# Patient Record
Sex: Female | Born: 1960 | Race: White | Hispanic: No | Marital: Married
Health system: Midwestern US, Academic
[De-identification: ages and names within clinical notes are randomized; demographics above are authoritative.]

## PROBLEM LIST (undated history)

## (undated) DIAGNOSIS — I82409 Acute embolism and thrombosis of unspecified deep veins of unspecified lower extremity: Secondary | ICD-10-CM

## (undated) DIAGNOSIS — E2839 Other primary ovarian failure: Secondary | ICD-10-CM

## (undated) DIAGNOSIS — B009 Herpesviral infection, unspecified: Secondary | ICD-10-CM

## (undated) DIAGNOSIS — M1990 Unspecified osteoarthritis, unspecified site: Secondary | ICD-10-CM

## (undated) HISTORY — DX: Unspecified osteoarthritis, unspecified site: M19.90

## (undated) HISTORY — DX: Acute embolism and thrombosis of unspecified deep veins of unspecified lower extremity: I82.409

## (undated) HISTORY — PX: ROOT CANAL: SHX2363

## (undated) HISTORY — DX: Other primary ovarian failure: E28.39

## (undated) HISTORY — DX: Herpesviral infection, unspecified: B00.9

---

## 2002-02-10 ENCOUNTER — Other Ambulatory Visit: Admission: RE | Admit: 2002-02-10 | Discharge: 2002-02-10 | Payer: Self-pay | Admitting: Obstetrics and Gynecology

## 2003-02-16 ENCOUNTER — Other Ambulatory Visit: Admission: RE | Admit: 2003-02-16 | Discharge: 2003-02-16 | Payer: Self-pay | Admitting: Obstetrics and Gynecology

## 2003-03-02 ENCOUNTER — Encounter: Admission: RE | Admit: 2003-03-02 | Discharge: 2003-03-02 | Payer: Self-pay | Admitting: Obstetrics and Gynecology

## 2004-02-28 ENCOUNTER — Other Ambulatory Visit: Admission: RE | Admit: 2004-02-28 | Discharge: 2004-02-28 | Payer: Self-pay | Admitting: Obstetrics and Gynecology

## 2004-07-10 ENCOUNTER — Encounter: Admission: RE | Admit: 2004-07-10 | Discharge: 2004-07-10 | Payer: Self-pay | Admitting: Obstetrics and Gynecology

## 2004-07-30 ENCOUNTER — Encounter: Admission: RE | Admit: 2004-07-30 | Discharge: 2004-07-30 | Payer: Self-pay | Admitting: Obstetrics and Gynecology

## 2005-03-16 ENCOUNTER — Encounter: Admission: RE | Admit: 2005-03-16 | Discharge: 2005-03-16 | Payer: Self-pay | Admitting: Sports Medicine

## 2005-04-23 ENCOUNTER — Other Ambulatory Visit: Admission: RE | Admit: 2005-04-23 | Discharge: 2005-04-23 | Payer: Self-pay | Admitting: Obstetrics and Gynecology

## 2005-07-17 ENCOUNTER — Encounter: Admission: RE | Admit: 2005-07-17 | Discharge: 2005-07-17 | Payer: Self-pay | Admitting: Obstetrics and Gynecology

## 2006-04-29 ENCOUNTER — Other Ambulatory Visit: Admission: RE | Admit: 2006-04-29 | Discharge: 2006-04-29 | Payer: Self-pay | Admitting: Obstetrics and Gynecology

## 2006-09-23 ENCOUNTER — Encounter: Admission: RE | Admit: 2006-09-23 | Discharge: 2006-09-23 | Payer: Self-pay | Admitting: Obstetrics and Gynecology

## 2007-04-09 DIAGNOSIS — I82409 Acute embolism and thrombosis of unspecified deep veins of unspecified lower extremity: Secondary | ICD-10-CM

## 2007-04-09 HISTORY — DX: Acute embolism and thrombosis of unspecified deep veins of unspecified lower extremity: I82.409

## 2007-07-29 ENCOUNTER — Other Ambulatory Visit: Admission: RE | Admit: 2007-07-29 | Discharge: 2007-07-29 | Payer: Self-pay | Admitting: Gynecology

## 2007-09-28 ENCOUNTER — Encounter: Admission: RE | Admit: 2007-09-28 | Discharge: 2007-09-28 | Payer: Self-pay | Admitting: Gynecology

## 2008-01-14 ENCOUNTER — Emergency Department (HOSPITAL_COMMUNITY): Admission: EM | Admit: 2008-01-14 | Discharge: 2008-01-14 | Payer: Self-pay | Admitting: Emergency Medicine

## 2008-01-14 ENCOUNTER — Ambulatory Visit (HOSPITAL_COMMUNITY): Admission: RE | Admit: 2008-01-14 | Discharge: 2008-01-14 | Payer: Self-pay | Admitting: Gynecology

## 2008-01-14 ENCOUNTER — Ambulatory Visit: Payer: Self-pay | Admitting: Vascular Surgery

## 2008-01-14 ENCOUNTER — Encounter: Payer: Self-pay | Admitting: Gynecology

## 2008-01-18 ENCOUNTER — Ambulatory Visit: Payer: Self-pay | Admitting: Gynecology

## 2008-01-19 ENCOUNTER — Ambulatory Visit: Payer: Self-pay | Admitting: Oncology

## 2008-01-21 ENCOUNTER — Ambulatory Visit: Payer: Self-pay | Admitting: Cardiovascular Disease

## 2008-01-21 LAB — CONVERTED CEMR LAB
INR: 1.3 — ABNORMAL HIGH
Prothrombin Time: 15.3 s — ABNORMAL HIGH

## 2008-01-25 ENCOUNTER — Ambulatory Visit: Payer: Self-pay | Admitting: Internal Medicine

## 2008-01-25 LAB — CONVERTED CEMR LAB
INR: 1.3 — ABNORMAL HIGH (ref 0.8–1.0)
INR: 1.6 — ABNORMAL HIGH (ref 0.8–1.0)
Prothrombin Time: 15.3 s — ABNORMAL HIGH (ref 10.9–13.3)
Prothrombin Time: 17.8 s — ABNORMAL HIGH (ref 10.9–13.3)

## 2008-01-28 ENCOUNTER — Ambulatory Visit: Payer: Self-pay | Admitting: Cardiovascular Disease

## 2008-01-28 LAB — CONVERTED CEMR LAB
INR: 2.4 — ABNORMAL HIGH (ref 0.8–1.0)
Prothrombin Time: 25.7 s — ABNORMAL HIGH (ref 10.9–13.3)

## 2008-02-04 ENCOUNTER — Ambulatory Visit: Payer: Self-pay | Admitting: Cardiovascular Disease

## 2008-02-04 LAB — CONVERTED CEMR LAB
INR: 4.9 — ABNORMAL HIGH (ref 0.8–1.0)
Prothrombin Time: 48.2 s — ABNORMAL HIGH (ref 10.9–13.3)

## 2008-02-10 ENCOUNTER — Ambulatory Visit: Payer: Self-pay | Admitting: Cardiovascular Disease

## 2008-02-18 ENCOUNTER — Ambulatory Visit: Payer: Self-pay | Admitting: Cardiology

## 2008-02-22 ENCOUNTER — Ambulatory Visit: Payer: Self-pay | Admitting: Cardiology

## 2008-02-26 ENCOUNTER — Ambulatory Visit: Payer: Self-pay | Admitting: Cardiovascular Disease

## 2008-03-09 ENCOUNTER — Ambulatory Visit: Payer: Self-pay | Admitting: Oncology

## 2008-03-09 LAB — PROTIME-INR
INR: 2.1 (ref 2.00–3.50)
Protime: 25.2 Seconds — ABNORMAL HIGH (ref 10.6–13.4)

## 2008-03-09 LAB — CBC & DIFF AND RETIC
BASO%: 0.6 % (ref 0.0–2.0)
Basophils Absolute: 0 10*3/uL (ref 0.0–0.1)
EOS%: 1.1 % (ref 0.0–7.0)
Eosinophils Absolute: 0.1 10*3/uL (ref 0.0–0.5)
HCT: 36.8 % (ref 34.8–46.6)
HGB: 12.5 g/dL (ref 11.6–15.9)
IRF: 0.39 — ABNORMAL HIGH (ref 0.130–0.330)
LYMPH%: 22.8 % (ref 14.0–48.0)
MCH: 29.8 pg (ref 26.0–34.0)
MCHC: 33.9 g/dL (ref 32.0–36.0)
MCV: 87.9 fL (ref 81.0–101.0)
MONO#: 0.6 10*3/uL (ref 0.1–0.9)
MONO%: 7.4 % (ref 0.0–13.0)
NEUT#: 5.1 10*3/uL (ref 1.5–6.5)
NEUT%: 68.1 % (ref 39.6–76.8)
Platelets: 243 10*3/uL (ref 145–400)
RBC: 4.19 10*6/uL (ref 3.70–5.32)
RDW: 12.7 % (ref 11.3–14.5)
RETIC #: 57.8 10*3/uL (ref 19.7–115.1)
Retic %: 1.4 % (ref 0.4–2.3)
WBC: 7.5 10*3/uL (ref 3.9–10.0)
lymph#: 1.7 10*3/uL (ref 0.9–3.3)

## 2008-03-09 LAB — CHCC SMEAR

## 2008-03-09 LAB — MORPHOLOGY
PLT EST: ADEQUATE
RBC Comments: NORMAL

## 2008-03-11 ENCOUNTER — Ambulatory Visit: Payer: Self-pay | Admitting: Internal Medicine

## 2008-03-15 LAB — CARDIOLIPIN ANTIBODIES, IGG, IGM, IGA
Anticardiolipin IgA: 15 [APL'U] (ref <13)
Anticardiolipin IgG: <7 [GPL'U] (ref <11)
Anticardiolipin IgM: <7 [MPL'U] (ref <10)

## 2008-03-15 LAB — COMPREHENSIVE METABOLIC PANEL
ALT: 11 U/L (ref 0–35)
AST: 12 U/L (ref 0–37)
Albumin: 4.3 g/dL (ref 3.5–5.2)
Alkaline Phosphatase: 18 U/L — ABNORMAL LOW (ref 39–117)
BUN: 18 mg/dL (ref 6–23)
CO2: 22 mEq/L (ref 19–32)
Calcium: 9.2 mg/dL (ref 8.4–10.5)
Chloride: 106 mEq/L (ref 96–112)
Creatinine, Ser: 0.86 mg/dL (ref 0.40–1.20)
Glucose, Bld: 90 mg/dL (ref 70–99)
Potassium: 3.8 mEq/L (ref 3.5–5.3)
Sodium: 138 mEq/L (ref 135–145)
Total Bilirubin: 0.7 mg/dL (ref 0.3–1.2)
Total Protein: 6.5 g/dL (ref 6.0–8.3)

## 2008-03-15 LAB — FACTOR 5 LEIDEN

## 2008-03-15 LAB — LUPUS ANTICOAGULANT PANEL

## 2008-03-15 LAB — LACTATE DEHYDROGENASE: LDH: 136 U/L (ref 94–250)

## 2008-03-15 LAB — BETA-2 GLYCOPROTEIN ANTIBODIES
Beta-2-Glycoprotein I IgA: 4 U/mL (ref <10)
Beta-2-Glycoprotein I IgM: <4 U/mL (ref <10)

## 2008-03-15 LAB — PROTHROMBIN GENE MUTATION

## 2008-03-15 LAB — ANTITHROMBIN III: AntiThromb III Func: 105 % (ref 76–126)

## 2008-03-15 LAB — D-DIMER, QUANTITATIVE: D-Dimer, Quant: 0.23 ug/mL-FEU (ref 0.00–0.48)

## 2008-03-23 ENCOUNTER — Ambulatory Visit: Payer: Self-pay | Admitting: Cardiology

## 2008-04-28 ENCOUNTER — Ambulatory Visit: Payer: Self-pay | Admitting: Gynecology

## 2008-06-30 ENCOUNTER — Ambulatory Visit: Payer: Self-pay | Admitting: Cardiology

## 2008-07-14 ENCOUNTER — Ambulatory Visit: Payer: Self-pay | Admitting: Internal Medicine

## 2008-08-26 ENCOUNTER — Telehealth (INDEPENDENT_AMBULATORY_CARE_PROVIDER_SITE_OTHER): Payer: Self-pay | Admitting: *Deleted

## 2008-08-29 ENCOUNTER — Ambulatory Visit: Payer: Self-pay | Admitting: Cardiovascular Disease

## 2008-09-06 ENCOUNTER — Encounter: Payer: Self-pay | Admitting: *Deleted

## 2008-10-12 ENCOUNTER — Encounter: Payer: Self-pay | Admitting: *Deleted

## 2008-10-19 ENCOUNTER — Encounter: Admission: RE | Admit: 2008-10-19 | Discharge: 2008-10-19 | Payer: Self-pay | Admitting: Gynecology

## 2008-10-19 ENCOUNTER — Ambulatory Visit: Payer: Self-pay | Admitting: Cardiology

## 2008-10-19 LAB — CONVERTED CEMR LAB
POC INR: 0.9
Prothrombin Time: 11.9 s

## 2008-11-16 ENCOUNTER — Other Ambulatory Visit: Admission: RE | Admit: 2008-11-16 | Discharge: 2008-11-16 | Payer: Self-pay | Admitting: Gynecology

## 2008-11-16 ENCOUNTER — Encounter: Payer: Self-pay | Admitting: Gynecology

## 2008-11-16 ENCOUNTER — Ambulatory Visit: Payer: Self-pay | Admitting: Gynecology

## 2008-11-24 ENCOUNTER — Telehealth (INDEPENDENT_AMBULATORY_CARE_PROVIDER_SITE_OTHER): Payer: Self-pay | Admitting: *Deleted

## 2008-12-05 ENCOUNTER — Encounter (INDEPENDENT_AMBULATORY_CARE_PROVIDER_SITE_OTHER): Payer: Self-pay | Admitting: Cardiology

## 2008-12-23 ENCOUNTER — Encounter (INDEPENDENT_AMBULATORY_CARE_PROVIDER_SITE_OTHER): Payer: Self-pay | Admitting: *Deleted

## 2009-01-13 ENCOUNTER — Encounter: Payer: Self-pay | Admitting: Internal Medicine

## 2010-02-22 ENCOUNTER — Other Ambulatory Visit: Admission: RE | Admit: 2010-02-22 | Discharge: 2010-02-22 | Payer: Self-pay | Admitting: Gynecology

## 2010-02-22 ENCOUNTER — Ambulatory Visit: Payer: Self-pay | Admitting: Gynecology

## 2010-03-08 ENCOUNTER — Encounter: Admission: RE | Admit: 2010-03-08 | Discharge: 2010-03-08 | Payer: Self-pay | Admitting: Gynecology

## 2010-04-28 ENCOUNTER — Encounter: Payer: Self-pay | Admitting: Gynecology

## 2010-04-29 ENCOUNTER — Encounter: Payer: Self-pay | Admitting: Gynecology

## 2010-08-21 NOTE — Assessment & Plan Note (Signed)
[facility]                            CARDIOLOGY OFFICE NOTE   Sharon Lopez, Sharon Lopez                     MRN:          [id number]  DATE:02/26/2008                            DOB:          August 06, 1960    HISTORY OF PRESENT ILLNESS:  Sharon Lopez is a pleasant 50 year old  Caucasian female with no significant past medical history, who had a  spontaneous deep venous thrombosis on the left lower extremity that was  diagnosed 5-1/2 weeks ago.  The patient had no precipitating factors for  this DVT.  She denied having any long car trips, long airplane trips,  immobility, or trauma to her leg.  She has had no prior episodes of DVT.  She has been on birth control pills since she was 50 years old.  She has  been followed here in the [facility] in our office and comes in to  see me today as we require [facility] patients to be seen during  their period of monitoring in our office.  She has no complaints except  for pain in her left foot.  She tells me that she twisted it while  playing tennis last week.  She has been maintained on Coumadin and  Lovenox because of an inability to regulate her INR appropriately.  She  denies having any chest pain, shortness of breath, palpitations,  dizziness, near syncope, syncope, orthopnea, PND, or swelling in her  feet.  She initially became aware of the clot in her left leg when she  noticed that her leg was swollen and was painful.  She was sent to the  [facility] by her gynecologist.   PAST MEDICAL HISTORY:  1. Left lower extremity DVT diagnosed 5-1/2 weeks ago.  2. No history of diabetes mellitus, hypertension, hyperlipidemia, or      cardiac disease.   PAST SURGICAL HISTORY:  None.   ALLERGIES:  No known drug allergies.   CURRENT MEDICATIONS:  Coumadin and Lovenox.   SOCIAL HISTORY:  The patient denies the use of tobacco or illicit drugs.  She has several glasses of wine 3-4 times per week.  She is  married and  has 3 children.  She is an active individual and plays tennis often and  works out frequently.   FAMILY HISTORY:  The patient's mother and father are both alive and  healthy.  Her mother does have hypertension.  She has one sister, who  has no medical problems.  She has no family history of clotting  disorders, sudden cardiac death, or premature coronary artery disease.   REVIEW OF SYSTEMS:  As stated in the history of present illness, is  otherwise negative.   PHYSICAL EXAMINATION:  VITAL SIGNS:  Blood pressure 110/70, pulse 65 and  regular, respirations 12 and nonlabored.  GENERAL:  She is a pleasant, young Caucasian female in no acute  distress.  She is alert and oriented x3.  PSYCHIATRIC:  Mood and affect are normal.  MUSCULOSKELETAL:  Muscle strength and tone is normal.  NEUROLOGICAL:  No focal neurological deficits.  SKIN:  Warm and dry.  HEENT:  Normal.  NECK:  No JVD.  No carotid bruits.  No thyromegaly.  No lymphadenopathy.  LUNGS:  Clear to auscultation bilaterally without wheezes, rhonchi, or  crackles noted.  CARDIOVASCULAR:  Regular rate and rhythm without murmurs, gallops, or  rubs noted.  ABDOMEN:  Soft and nontender.  Bowel sounds are present.  EXTREMITIES:  No evidence of lower extremity edema.  Pulses are 2+ in  all extremities.  There is no pain to palpation of the right or left  calf muscle.   DIAGNOSTIC STUDIES:  A 12-lead EKG obtained in our office today shows  normal sinus rhythm with a ventricular rate of 65 beats per minute.  This is a normal EKG.   ASSESSMENT AND PLAN:  This is a pleasant 50 year old Caucasian female  with recent diagnosis of an unprovoked deep vein thrombosis in the left  lower extremity.  The patient has no family history of clotting  disorders or spontaneous abortions.  She also does not have a clear  reason why she has a deep vein thrombosis in her left leg.  I would like  to continue to follow her here in [facility]in  Clinic.  I think it would be  appropriate to send her to see Sharon Lopez of Hematology to assess  the possibility of a clotting disorder.  We will make arrangements for  the patient to be seen in his office.  We will plan on continuing to  follow her in the [facility], however, I will not see her back in  our Cardiology office unless she needs to be seen.     Sharon Carrow, MD  Electronically Signed    CM/MedQ  DD: 02/26/2008  DT: 02/27/2008  Job #: [id number]   cc:   Sharon Lopez, M.D.

## 2010-09-26 ENCOUNTER — Institutional Professional Consult (permissible substitution): Payer: Self-pay | Admitting: Gynecology

## 2010-12-14 ENCOUNTER — Ambulatory Visit (INDEPENDENT_AMBULATORY_CARE_PROVIDER_SITE_OTHER): Payer: Self-pay | Admitting: Gynecology

## 2010-12-14 ENCOUNTER — Encounter: Payer: Self-pay | Admitting: Gynecology

## 2010-12-14 DIAGNOSIS — N920 Excessive and frequent menstruation with regular cycle: Secondary | ICD-10-CM

## 2010-12-14 DIAGNOSIS — N644 Mastodynia: Secondary | ICD-10-CM

## 2010-12-14 DIAGNOSIS — N63 Unspecified lump in unspecified breast: Secondary | ICD-10-CM

## 2010-12-14 MED ORDER — CEPHALEXIN 250 MG PO CAPS: 250.0000 mg | ORAL_CAPSULE | Freq: Four times a day (QID) | ORAL | Status: AC

## 2010-12-14 NOTE — Progress Notes (Signed)
Patient presents having awoken this morning with her left breast wall and greater than her right. She notes it's tender and just a generalized swelling. No history of trauma or episodes like this in the past. She has no nipple discharge. Menses are regular no skipped menses last mammogram was December 2011 it was normal.  Exam Neck: No cervical or supraclavicular adenopathy Breasts: Examined lying and sitting. Right without masses retractions discharge adenopathy. Left visibly larger than the right no focal masses, mildly tender throughout, no skin erythema, vascular dilatation. No nipple discharge axillary adenopathy. Chest wall / upper extremities: No evidence of swelling or abnormalities on her chest wall or upper extremities to suggest deeper thromboembolic event.  Assessment and plan: Left breast swelling acute. Discussed differential to include infectious such as mastitis, thromboembolic such as deep or superficial, hormonal, breast cancer. Unusual presentation for any of the above given the acute onset, nonpregnant and generalized swelling without any focal areas.  We'll start with the prolactin level and TSH start on antibiotics to cover infectious with Keflex 250 4 times a day x7 and start with diagnostic mammogram and ultrasound. Patient will follow up with me after her studies.  She was also discussing that her periods are heavier as to how the past after she stopped her oral contraceptives I reviewed possible workup to include sonohysterogram. I ordered a CBC with her TSH today. We'll on the ultrasound until we resolve her breast issue. She says she due for annual in November December time frame and she'll follow up for that and then we'll pursue evaluation of her menorrhagia.

## 2010-12-17 ENCOUNTER — Telehealth: Payer: Self-pay | Admitting: *Deleted

## 2010-12-17 NOTE — Telephone Encounter (Signed)
Pt called regarding the appointment for diag. Mammogram and ultrasound for breast center. The breast will call pt today to get appointment set for pt. Lm on pt vm with the above information phone number left on pt voice mail as well.

## 2010-12-19 ENCOUNTER — Ambulatory Visit
Admission: RE | Admit: 2010-12-19 | Discharge: 2010-12-19 | Disposition: A | Discharge status: Home / Self Care | Payer: Self-pay | Source: Ambulatory Visit | Attending: Gynecology | Admitting: Gynecology

## 2010-12-19 ENCOUNTER — Other Ambulatory Visit: Payer: Self-pay | Admitting: Gynecology

## 2010-12-19 DIAGNOSIS — N644 Mastodynia: Secondary | ICD-10-CM

## 2010-12-19 DIAGNOSIS — N63 Unspecified lump in unspecified breast: Secondary | ICD-10-CM

## 2011-01-08 LAB — CBC
MCHC: 33.3
RDW: 12.5

## 2011-01-08 LAB — BASIC METABOLIC PANEL
CO2: 26
Calcium: 8.8
Creatinine, Ser: 1.09
GFR calc Af Amer: >60
GFR calc non Af Amer: 54 — ABNORMAL LOW
Glucose, Bld: 99
Sodium: 137

## 2011-01-08 LAB — APTT: aPTT: 26

## 2011-01-08 LAB — DIFFERENTIAL
Basophils Absolute: 0.1
Basophils Relative: 1
Lymphocytes Relative: 31
Neutro Abs: 4.7
Neutrophils Relative %: 55

## 2011-01-08 LAB — PROTIME-INR
INR: 1
Prothrombin Time: 13

## 2011-11-12 ENCOUNTER — Other Ambulatory Visit: Payer: Self-pay | Admitting: Gynecology

## 2011-11-12 DIAGNOSIS — Z1231 Encounter for screening mammogram for malignant neoplasm of breast: Secondary | ICD-10-CM

## 2011-12-23 ENCOUNTER — Ambulatory Visit
Admission: RE | Admit: 2011-12-23 | Discharge: 2011-12-23 | Disposition: A | Discharge status: Home / Self Care | Payer: Private Health Insurance - Indemnity | Source: Ambulatory Visit | Attending: Gynecology | Admitting: Gynecology

## 2011-12-23 DIAGNOSIS — Z1231 Encounter for screening mammogram for malignant neoplasm of breast: Secondary | ICD-10-CM

## 2011-12-25 ENCOUNTER — Other Ambulatory Visit: Payer: Self-pay | Admitting: *Deleted

## 2011-12-25 DIAGNOSIS — N63 Unspecified lump in unspecified breast: Secondary | ICD-10-CM

## 2011-12-30 ENCOUNTER — Ambulatory Visit
Admission: RE | Admit: 2011-12-30 | Discharge: 2011-12-30 | Disposition: A | Discharge status: Home / Self Care | Payer: Private Health Insurance - Indemnity | Source: Ambulatory Visit | Attending: Gynecology | Admitting: Gynecology

## 2011-12-30 DIAGNOSIS — N63 Unspecified lump in unspecified breast: Secondary | ICD-10-CM

## 2012-08-06 ENCOUNTER — Encounter: Payer: Self-pay | Admitting: Gynecology

## 2012-08-06 ENCOUNTER — Ambulatory Visit (INDEPENDENT_AMBULATORY_CARE_PROVIDER_SITE_OTHER): Payer: Private Health Insurance - Indemnity | Admitting: Gynecology

## 2012-08-06 ENCOUNTER — Other Ambulatory Visit (HOSPITAL_COMMUNITY)
Admission: RE | Admit: 2012-08-06 | Discharge: 2012-08-06 | Disposition: A | Discharge status: Home / Self Care | Payer: Private Health Insurance - Indemnity | Source: Ambulatory Visit | Attending: Gynecology | Admitting: Gynecology

## 2012-08-06 VITALS — BP 112/76 | Ht 66.0 in | Wt 126.0 lb

## 2012-08-06 DIAGNOSIS — Z01419 Encounter for gynecological examination (general) (routine) without abnormal findings: Secondary | ICD-10-CM

## 2012-08-06 DIAGNOSIS — Z1151 Encounter for screening for human papillomavirus (HPV): Secondary | ICD-10-CM | POA: Insufficient documentation

## 2012-08-06 DIAGNOSIS — N951 Menopausal and female climacteric states: Secondary | ICD-10-CM

## 2012-08-06 DIAGNOSIS — G4700 Insomnia, unspecified: Secondary | ICD-10-CM

## 2012-08-06 DIAGNOSIS — N926 Irregular menstruation, unspecified: Secondary | ICD-10-CM

## 2012-08-06 DIAGNOSIS — Z13220 Encounter for screening for lipoid disorders: Secondary | ICD-10-CM

## 2012-08-06 LAB — COMPREHENSIVE METABOLIC PANEL
ALT: 10 U/L (ref 0–35)
AST: 15 U/L (ref 0–37)
Albumin: 4.3 g/dL (ref 3.5–5.2)
Alkaline Phosphatase: 17 U/L — ABNORMAL LOW (ref 39–117)
BUN: 16 mg/dL (ref 6–23)
Potassium: 4.3 mEq/L (ref 3.5–5.3)

## 2012-08-06 LAB — LIPID PANEL
HDL: 91 mg/dL (ref >39)
LDL Cholesterol: 76 mg/dL (ref 0–99)
Triglycerides: 53 mg/dL (ref <150)
VLDL: 11 mg/dL (ref 0–40)

## 2012-08-06 LAB — CBC WITH DIFFERENTIAL/PLATELET
Basophils Absolute: 0.1 10*3/uL (ref 0.0–0.1)
Basophils Relative: 1 % (ref 0–1)
MCHC: 33.9 g/dL (ref 30.0–36.0)
Monocytes Absolute: 0.6 10*3/uL (ref 0.1–1.0)
Neutro Abs: 6.8 10*3/uL (ref 1.7–7.7)
Neutrophils Relative %: 74 % (ref 43–77)
Platelets: 281 10*3/uL (ref 150–400)
RDW: 13.2 % (ref 11.5–15.5)
WBC: 9.1 10*3/uL (ref 4.0–10.5)

## 2012-08-06 MED ORDER — ZOLPIDEM TARTRATE 10 MG PO TABS: 10.0000 mg | ORAL_TABLET | Freq: Every evening | ORAL | Status: DC | PRN

## 2012-08-06 NOTE — Progress Notes (Signed)
ADDY MCMANNIS June 01, 1960 [id number]        52 y.o.  G3P3003 for annual exam.  Several issues noted below.  Past medical history,surgical history, medications, allergies, family history and social history were all reviewed and documented in the EPIC chart. ROS:  Was performed and pertinent positives and negatives are included in the history.  Exam: Kim assistant Filed Vitals:   08/06/12 1446  BP: 112/76  Height: 5\' 6"  (1.676 m)  Weight: 126 lb (57.153 kg)   General appearance  Normal Skin grossly normal Head/Neck normal with no cervical or supraclavicular adenopathy thyroid normal Lungs  clear Cardiac RR, without RMG Abdominal  soft, nontender, without masses, organomegaly or hernia Breasts  examined lying and sitting without masses, retractions, discharge or axillary adenopathy. Pelvic  Ext/BUS/vagina  normal   Cervix  normal Pap/HPV  Uterus  anteverted, normal size, shape and contour, midline and mobile nontender   Adnexa  Without masses or tenderness    Anus and perineum  normal   Rectovaginal  normal sphincter tone without palpated masses or tenderness.    Assessment/Plan:  52 y.o. G23P3003 female for annual exam.   1. Irregular menses/menopausal symptoms. Patient started to have hot flushes and night sweats. Menses have become more irregular where she will have some skips but also spotting in between. We'll check baseline labs to include FSH TSH prolactin and sonohysterogram. Discussed perimenopausal issues. She does have a history of DVT on birth control pills and the issue of HRT discussed. Possible low dose transdermal if significant symptoms but realistic risk of recurrent thrombosis discussed. Alternatives such as OTC soy and Effexor reviewed. We'll rediscuss after lab work and ultrasound. 2. Insomnia. Patient has some issues with insomnia. Asked about sleep aids. Does not want to take nightly but to use occasionally as needed. Ambien 10 mg #30 with 2 refills provided.  Sleep activity risks reviewed. 3. Mammography 12/2011. Continue with annual mammography. SBE monthly review. 4. Pap smear 2011. Pap/HPV done today. No history of abnormal Pap smears. Assuming negative then plan less frequent screening interval. 5. Health maintenance. Baseline CBC comprehensive metabolic panel lipid profile urinalysis ordered along with her above hormone studies. Followup present histogram and lab work and we'll go from there.    Dara Lords MD, 4:51 PM 08/06/2012

## 2012-08-06 NOTE — Patient Instructions (Signed)
Follow up for ultrasound as scheduled 

## 2012-08-07 ENCOUNTER — Other Ambulatory Visit: Payer: Self-pay | Admitting: Gynecology

## 2012-08-07 DIAGNOSIS — R7989 Other specified abnormal findings of blood chemistry: Secondary | ICD-10-CM

## 2012-08-07 LAB — URINALYSIS W MICROSCOPIC + REFLEX CULTURE
Bilirubin Urine: NEGATIVE
Glucose, UA: NEGATIVE mg/dL
Hgb urine dipstick: NEGATIVE
Protein, ur: NEGATIVE mg/dL

## 2012-08-07 LAB — FOLLICLE STIMULATING HORMONE: FSH: 51.9 m[IU]/mL

## 2012-08-07 LAB — PROLACTIN: Prolactin: 7.8 ng/mL

## 2012-08-13 ENCOUNTER — Other Ambulatory Visit: Payer: Self-pay | Admitting: Gynecology

## 2012-08-13 DIAGNOSIS — N926 Irregular menstruation, unspecified: Secondary | ICD-10-CM

## 2012-09-02 ENCOUNTER — Ambulatory Visit (INDEPENDENT_AMBULATORY_CARE_PROVIDER_SITE_OTHER): Payer: Private Health Insurance - Indemnity

## 2012-09-02 ENCOUNTER — Encounter: Payer: Self-pay | Admitting: Gynecology

## 2012-09-02 ENCOUNTER — Ambulatory Visit (INDEPENDENT_AMBULATORY_CARE_PROVIDER_SITE_OTHER): Payer: Private Health Insurance - Indemnity | Admitting: Gynecology

## 2012-09-02 DIAGNOSIS — N840 Polyp of corpus uteri: Secondary | ICD-10-CM

## 2012-09-02 DIAGNOSIS — D259 Leiomyoma of uterus, unspecified: Secondary | ICD-10-CM

## 2012-09-02 DIAGNOSIS — N839 Noninflammatory disorder of ovary, fallopian tube and broad ligament, unspecified: Secondary | ICD-10-CM

## 2012-09-02 DIAGNOSIS — N838 Other noninflammatory disorders of ovary, fallopian tube and broad ligament: Secondary | ICD-10-CM

## 2012-09-02 DIAGNOSIS — R7989 Other specified abnormal findings of blood chemistry: Secondary | ICD-10-CM

## 2012-09-02 DIAGNOSIS — N938 Other specified abnormal uterine and vaginal bleeding: Secondary | ICD-10-CM

## 2012-09-02 DIAGNOSIS — N930 Postcoital and contact bleeding: Secondary | ICD-10-CM

## 2012-09-02 DIAGNOSIS — D252 Subserosal leiomyoma of uterus: Secondary | ICD-10-CM

## 2012-09-02 DIAGNOSIS — N852 Hypertrophy of uterus: Secondary | ICD-10-CM

## 2012-09-02 DIAGNOSIS — N926 Irregular menstruation, unspecified: Secondary | ICD-10-CM

## 2012-09-02 DIAGNOSIS — R799 Abnormal finding of blood chemistry, unspecified: Secondary | ICD-10-CM

## 2012-09-02 DIAGNOSIS — N949 Unspecified condition associated with female genital organs and menstrual cycle: Secondary | ICD-10-CM

## 2012-09-02 LAB — BUN: BUN: 20 mg/dL (ref 6–23)

## 2012-09-02 LAB — CREATININE, SERUM: Creat: 0.92 mg/dL (ref 0.50–1.10)

## 2012-09-02 NOTE — Progress Notes (Addendum)
The patient presents for sonohysterogram due to perimenopausal irregular bleeding. She has both skipped and irregular menses as well as intermenstrual spotting. FSH was elevated at 52. Her creatinine was also elevated at 1.7 and we have repeated that with BUN today.  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 Endometrial echo 13 mm with solid appearing mass. Right ovary normal. Left ovary with probable corpus luteum. Cul-de-sac negative. Sonohysterogram was performed, sterile technique, good distention with large endometrial polyp measuring 25 x 23 x 16 mm. Endometrial sample taken. Patient tolerated well.  Assessment and plan: Perimenopausal irregular bleeding with intermenstrual spotting. Sonohysterogram shows large defect consistent with polyp possible myoma. Discussed situation with patient and we'll plan proceeding with hysteroscopic resection. I reviewed what is involved with this to include the expected intraoperative and postoperative courses. The hysteroscopy D&C use of resectoscope portion also discussed. The risks to include infection, prolonged antibiotics, hemorrhage necessitating transfusion and the risks of transfusion, uterine perforation with internal organ damage such as bowel bladder ureters vessels and nerves necessitating major exploratory reparative surgeries and future reparative surgeries including bowel resection and bladder repair ureteral damage repair ostomy formation was all discussed. Distending media absorption with metabolic complications such as coma seizures discussed. No guarantees as far as relief of irregular bleeding was also discussed that she is perimenopausal and I suspect her irregular menses will continue. She does have a history of DVT associated with birth control pills. The issues of perioperative prophylaxis was also reviewed and possibilities for heparin or low molecular weight treatment also discussed.  Patient agrees with the plan and will schedule this at her convenience. She'll followup for the endometrial biopsy results also. A repeat creatinine and BUN was ordered.

## 2012-09-02 NOTE — Patient Instructions (Signed)
Office will call you to arrange surgery. 

## 2012-09-03 ENCOUNTER — Telehealth: Payer: Self-pay

## 2012-09-03 NOTE — Telephone Encounter (Signed)
Left message for patient to call to discuss scheduling surgery. I also have test result for her.

## 2012-09-16 ENCOUNTER — Telehealth: Payer: Self-pay

## 2012-09-16 NOTE — Telephone Encounter (Signed)
Patient called to check on repeat lab results. Informed BUN and creatinine were normal on repeat.  Patient also inquired about me calling to schedule surgery. She said that she thought that she was just going to watch things and did not realize she needed to schedule surgery.  Dr. Kristie Cowman office note clearly speaks of surgery and that he discussed this with her.  I called her and left message that labs normal and to call me to discuss the surgery.

## 2012-09-22 ENCOUNTER — Telehealth: Payer: Self-pay

## 2012-09-22 NOTE — Telephone Encounter (Signed)
Patient called in voice mail.  She asked again what Dr. Velvet Bathe biopsied and is ready to look at dates for surgery. [place]. I called her back and left voice mail.  I explained that Dr. Velvet Bathe did endometrial biopsy and it was benign. The surgery is to remove the endometrial polyp that he saw at [facility].  I left message and asked her to call me with dates she would like to try to schedule.

## 2012-10-15 ENCOUNTER — Telehealth: Payer: Self-pay

## 2012-10-15 NOTE — Telephone Encounter (Signed)
I called patient as I have left several messages and have not heard from patient.  I reached her today and she acknowledges that she know she needs to have Hyst D&C done but states she has been too busy.  She gave me a few dates in the future she might can make work and I will move forward with trying to get this scheduled and let her know.

## 2012-10-16 ENCOUNTER — Other Ambulatory Visit: Payer: Self-pay | Admitting: Gynecology

## 2012-10-16 ENCOUNTER — Telehealth: Payer: Self-pay

## 2012-10-16 MED ORDER — ENOXAPARIN SODIUM 40 MG/0.4ML ~~LOC~~ SOLN: 40.0000 mg | SUBCUTANEOUS | Status: DC

## 2012-10-16 NOTE — Telephone Encounter (Signed)
I called patient earlier and gave her surgery date Aug 21 7:30am.  She needed to confirm with husband and she called me back to tell me that this date will be fine.  I advised her regarding need for Lovenox 40 mg sub-q daily x 7 days prior to surgery. She said she has done this before and denies need for instruction.  Is familiar.  E-scribed to pharmacy. Consult scheduled.

## 2012-11-13 ENCOUNTER — Encounter (HOSPITAL_COMMUNITY): Payer: Self-pay

## 2012-11-19 ENCOUNTER — Telehealth: Payer: Self-pay

## 2012-11-19 ENCOUNTER — Telehealth: Payer: Self-pay | Admitting: *Deleted

## 2012-11-19 ENCOUNTER — Other Ambulatory Visit: Payer: Self-pay | Admitting: Gynecology

## 2012-11-19 MED ORDER — ENOXAPARIN SODIUM 40 MG/0.4ML ~~LOC~~ SOLN: SUBCUTANEOUS | Status: DC

## 2012-11-19 NOTE — Telephone Encounter (Signed)
Patient called and there was some confusion regarding her Lovenox.  I spoke with her and explained the only Lovenox before surgery will be given to her at the hospital right before surgery.  She will use the subcutaneous Lovenox 40mg  daily for seven days after surgery.  She verbalized understanding that she was use this after surgery.  I spoke with her pharmacist and gave her Rx and clarified order for after surgery with her as well.  Patient will be here tomorrow for her pre-op cons with Dr. Velvet Bathe.

## 2012-11-20 ENCOUNTER — Other Ambulatory Visit: Payer: Self-pay | Admitting: Gynecology

## 2012-11-20 ENCOUNTER — Telehealth: Payer: Self-pay

## 2012-11-20 ENCOUNTER — Encounter: Payer: Self-pay | Admitting: Gynecology

## 2012-11-20 ENCOUNTER — Ambulatory Visit (INDEPENDENT_AMBULATORY_CARE_PROVIDER_SITE_OTHER): Payer: Private Health Insurance - Indemnity | Admitting: Gynecology

## 2012-11-20 DIAGNOSIS — N840 Polyp of corpus uteri: Secondary | ICD-10-CM

## 2012-11-20 MED ORDER — MISOPROSTOL 200 MCG PO TABS: ORAL_TABLET | ORAL | Status: DC

## 2012-11-20 NOTE — Patient Instructions (Signed)
Followup for surgery as scheduled. 

## 2012-11-20 NOTE — Progress Notes (Signed)
Sharon Lopez [birth year] [id number]   Preoperative consult  Chief complaint: Endometrial polyp  History of present illness: 52 y.o. G3P3003 history of irregular menses and spotting in between. Evaluation included elevated FSH at 52 and sonohysterogram which showed an endometrial polyp measuring 25 x 23 x 16 mm. patient's admitted for hysteroscopy D&C with resection of her endometrial polyp.   Past medical history,surgical history, medications, allergies, family history and social history were all reviewed and documented in the EPIC chart. ROS:  Was performed and pertinent positives and negatives are included in the history of present illness.  Exam:  Kim assistant General: well developed, well nourished female, no acute distress HEENT: normal  Lungs: clear to auscultation without wheezing, rales or rhonchi  Cardiac: regular rate without rubs, murmurs or gallops  Abdomen: soft, nontender without masses, guarding, rebound, organomegaly  Pelvic: external bus vagina: normal   Cervix: grossly normal  Uterus: normal size, midline and mobile, nontender  Adnexa: without masses or tenderness    Assessment/Plan:  52 y.o. [id number] perimenopausal irregular bleeding with sonohysterogram showing large endometrial polyp. Patient's admitted for hysteroscopy D&C with resection of the endometrial polyp.  I reviewed what is involved with the procedure to include the expected intraoperative and postoperative courses. The hysteroscopy, D&C and use of the resectoscope was discussed. The risks to include infection, prolonged antibiotics, hemorrhage necessitating transfusion and the risks of transfusion, uterine perforation with internal organ damage such as bowel, bladder, ureters, vessels and nerves necessitating major exploratory reparative surgeries and future reparative surgeries including bowel resection and bladder repair ureteral damage repair, ostomy formation was all discussed. Distending media absorption  with metabolic complications such as coma seizures discussed. No guarantees as far as relief of irregular bleeding was also discussed that she is perimenopausal and I suspect her irregular menses may continue. She does have a history of DVT associated with birth control pills. And we are planning Lovenox prophylaxis to start 2 hours before surgery and continue daily for one week. Possibilities of this intensifying postoperative bleeding was also discussed. Patient's questions were answered and she is ready to proceed with surgery.   Note: This document was prepared with digital dictation and possible smart phrase technology. Any transcriptional errors that result from this process are unintentional.  Dara Lords MD, 11:44 AM 11/20/2012

## 2012-11-20 NOTE — Telephone Encounter (Signed)
I called patient and left her detailed message in cell ph voice mail that she needs Cytotec tab vaginally night before surgery.  I called it in to her pharmacy.

## 2012-11-20 NOTE — H&P (Signed)
  Sharon Lopez 06-06-1960 [id number]   History and Physical  Chief complaint: Endometrial polyp  History of present illness: 52 y.o. G3P3003 history of irregular menses and spotting in between. Evaluation included elevated FSH at 52 and sonohysterogram which showed an endometrial polyp measuring 25 x 23 x 16 mm. patient's admitted for hysteroscopy D&C with resection of her endometrial polyp.   Past medical history,surgical history, medications, allergies, family history and social history were all reviewed and documented in the EPIC chart. ROS:  Was performed and pertinent positives and negatives are included in the history of present illness.  Exam:  Kim assistant General: well developed, well nourished female, no acute distress HEENT: normal  Lungs: clear to auscultation without wheezing, rales or rhonchi  Cardiac: regular rate without rubs, murmurs or gallops  Abdomen: soft, nontender without masses, guarding, rebound, organomegaly  Pelvic: external bus vagina: normal   Cervix: grossly normal  Uterus: normal size, midline and mobile, nontender  Adnexa: without masses or tenderness    Assessment/Plan:  52 y.o. [id number] perimenopausal irregular bleeding with sonohysterogram showing large endometrial polyp. Patient's admitted for hysteroscopy D&C with resection of the endometrial polyp.  I reviewed what is involved with the procedure to include the expected intraoperative and postoperative courses. The hysteroscopy, D&C and use of the resectoscope was discussed. The risks to include infection, prolonged antibiotics, hemorrhage necessitating transfusion and the risks of transfusion, uterine perforation with internal organ damage such as bowel, bladder, ureters, vessels and nerves necessitating major exploratory reparative surgeries and future reparative surgeries including bowel resection and bladder repair ureteral damage repair, ostomy formation was all discussed. Distending media  absorption with metabolic complications such as coma seizures discussed. No guarantees as far as relief of irregular bleeding was also discussed that she is perimenopausal and I suspect her irregular menses may continue. She does have a history of DVT associated with birth control pills. And we are planning Lovenox prophylaxis to start 2 hours before surgery and continue daily for one week. Possibilities of this intensifying postoperative bleeding was also discussed. Patient's questions were answered and she is ready to proceed with surgery.   Note: This document was prepared with digital dictation and possible smart phrase technology. Any transcriptional errors that result from this process are unintentional.  Dara Lords MD, 11:50 AM 11/20/2012

## 2012-11-25 ENCOUNTER — Encounter (HOSPITAL_COMMUNITY)
Admission: RE | Admit: 2012-11-25 | Discharge: 2012-11-25 | Disposition: A | Discharge status: Home / Self Care | Payer: Managed Care, Other (non HMO) | Source: Ambulatory Visit | Attending: Gynecology | Admitting: Gynecology

## 2012-11-25 ENCOUNTER — Encounter (HOSPITAL_COMMUNITY): Payer: Self-pay

## 2012-11-25 MED ORDER — DEXTROSE 5 % IV SOLN
2.0000 g | INTRAVENOUS | Status: AC
  Administered 2012-11-26: 2 g via INTRAVENOUS
  Filled 2012-11-25: qty 2

## 2012-11-25 MED ORDER — ENOXAPARIN SODIUM 40 MG/0.4ML ~~LOC~~ SOLN
40.0000 mg | SUBCUTANEOUS | Status: AC
  Administered 2012-11-26: 40 mg via SUBCUTANEOUS
  Filled 2012-11-25: qty 0.4

## 2012-11-25 NOTE — Patient Instructions (Addendum)
20 [name]  11/25/2012   Your procedure is scheduled on:  11/26/12  Enter through the Main Entrance of [facility] at 6 AM.  Pick up the phone at the desk and dial [phone].   Call this number if you have problems the morning of surgery: [phone]   Remember:   Do not eat food:After Midnight.  Do not drink clear liquids: After Midnight.  Take these medicines the morning of surgery with A SIP OF WATER: NA   Do not wear jewelry, make-up or nail polish.  Do not wear lotions, powders, or perfumes. You may wear deodorant.  Do not shave 48 hours prior to surgery.  Do not bring valuables to the hospital.  [facility] is not responsible                  for any belongings or valuables brought to the hospital.  Contacts, dentures or bridgework may not be worn into surgery.  Leave suitcase in the car. After surgery it may be brought to your room.  For patients admitted to the hospital, checkout time is 11:00 AM the day of                discharge.   Patients discharged the day of surgery will not be allowed to drive                   home.  Name and phone number of your driver: husband   Lorin Picket  Special Instructions: Shower using CHG 2 nights before surgery and the night before surgery.  If you shower the day of surgery use CHG.  Use special wash - you have one bottle of CHG for all showers.  You should use approximately 1/3 of the bottle for each shower.   Please read over the following fact sheets that you were given: Surgical Site Infection Prevention

## 2012-11-26 ENCOUNTER — Encounter (HOSPITAL_COMMUNITY)
Admission: RE | Disposition: A | Discharge status: Home / Self Care | Payer: Self-pay | Source: Ambulatory Visit | Attending: Gynecology

## 2012-11-26 ENCOUNTER — Encounter (HOSPITAL_COMMUNITY): Payer: Self-pay | Admitting: Anesthesiology

## 2012-11-26 ENCOUNTER — Ambulatory Visit (HOSPITAL_COMMUNITY)
Admission: RE | Admit: 2012-11-26 | Discharge: 2012-11-26 | Disposition: A | Discharge status: Home / Self Care | Payer: Managed Care, Other (non HMO) | Source: Ambulatory Visit | Attending: Gynecology | Admitting: Gynecology

## 2012-11-26 ENCOUNTER — Ambulatory Visit (HOSPITAL_COMMUNITY): Payer: Managed Care, Other (non HMO) | Admitting: Anesthesiology

## 2012-11-26 DIAGNOSIS — N926 Irregular menstruation, unspecified: Secondary | ICD-10-CM | POA: Insufficient documentation

## 2012-11-26 DIAGNOSIS — N840 Polyp of corpus uteri: Secondary | ICD-10-CM | POA: Insufficient documentation

## 2012-11-26 DIAGNOSIS — D250 Submucous leiomyoma of uterus: Secondary | ICD-10-CM | POA: Insufficient documentation

## 2012-11-26 DIAGNOSIS — D259 Leiomyoma of uterus, unspecified: Secondary | ICD-10-CM

## 2012-11-26 HISTORY — PX: DILATATION & CURETTAGE/HYSTEROSCOPY WITH TRUECLEAR: SHX6353

## 2012-11-26 LAB — CBC
HCT: 40 % (ref 36.0–46.0)
Hemoglobin: 13.6 g/dL (ref 12.0–15.0)
MCH: 31.9 pg (ref 26.0–34.0)
RBC: 4.27 MIL/uL (ref 3.87–5.11)

## 2012-11-26 LAB — PREGNANCY, URINE: Preg Test, Ur: NEGATIVE

## 2012-11-26 SURGERY — DILATATION & CURETTAGE/HYSTEROSCOPY WITH TRUCLEAR
Anesthesia: General | Site: Uterus | Wound class: Clean Contaminated

## 2012-11-26 MED ORDER — LIDOCAINE HCL (CARDIAC) 20 MG/ML IV SOLN
INTRAVENOUS | Status: AC
  Filled 2012-11-26: qty 5

## 2012-11-26 MED ORDER — LIDOCAINE HCL (CARDIAC) 20 MG/ML IV SOLN
INTRAVENOUS | Status: DC | PRN
  Administered 2012-11-26: 70 mg via INTRAVENOUS

## 2012-11-26 MED ORDER — OXYCODONE-ACETAMINOPHEN 5-325 MG PO TABS: 1.0000 | ORAL_TABLET | ORAL | Status: DC | PRN

## 2012-11-26 MED ORDER — PROPOFOL 10 MG/ML IV EMUL
INTRAVENOUS | Status: AC
  Filled 2012-11-26: qty 20

## 2012-11-26 MED ORDER — KETOROLAC TROMETHAMINE 30 MG/ML IJ SOLN
INTRAMUSCULAR | Status: DC | PRN
  Administered 2012-11-26: 30 mg via INTRAVENOUS

## 2012-11-26 MED ORDER — KETOROLAC TROMETHAMINE 30 MG/ML IJ SOLN
INTRAMUSCULAR | Status: AC
  Filled 2012-11-26: qty 1

## 2012-11-26 MED ORDER — KETOROLAC TROMETHAMINE 30 MG/ML IJ SOLN: 15.0000 mg | Freq: Once | INTRAMUSCULAR | Status: DC | PRN

## 2012-11-26 MED ORDER — LACTATED RINGERS IV SOLN
INTRAVENOUS | Status: DC
  Administered 2012-11-26 (×2): via INTRAVENOUS

## 2012-11-26 MED ORDER — SCOPOLAMINE 1 MG/3DAYS TD PT72: 1.0000 | MEDICATED_PATCH | TRANSDERMAL | Status: DC

## 2012-11-26 MED ORDER — MIDAZOLAM HCL 2 MG/2ML IJ SOLN
INTRAMUSCULAR | Status: AC
  Filled 2012-11-26: qty 2

## 2012-11-26 MED ORDER — PROMETHAZINE HCL 25 MG/ML IJ SOLN: 6.2500 mg | INTRAMUSCULAR | Status: DC | PRN

## 2012-11-26 MED ORDER — GLYCOPYRROLATE 0.2 MG/ML IJ SOLN
INTRAMUSCULAR | Status: AC
  Filled 2012-11-26: qty 1

## 2012-11-26 MED ORDER — FENTANYL CITRATE 0.05 MG/ML IJ SOLN: 25.0000 ug | INTRAMUSCULAR | Status: DC | PRN

## 2012-11-26 MED ORDER — LIDOCAINE HCL 1 % IJ SOLN
INTRAMUSCULAR | Status: DC | PRN
  Administered 2012-11-26: 10 mL

## 2012-11-26 MED ORDER — FENTANYL CITRATE 0.05 MG/ML IJ SOLN
INTRAMUSCULAR | Status: AC
  Administered 2012-11-26: 25 ug via INTRAVENOUS
  Filled 2012-11-26: qty 2

## 2012-11-26 MED ORDER — MEPERIDINE HCL 25 MG/ML IJ SOLN: 12.5000 mg | Freq: Once | INTRAMUSCULAR | Status: DC

## 2012-11-26 MED ORDER — MEPERIDINE HCL 25 MG/ML IJ SOLN
INTRAMUSCULAR | Status: AC
  Filled 2012-11-26: qty 1

## 2012-11-26 MED ORDER — ONDANSETRON HCL 4 MG/2ML IJ SOLN
INTRAMUSCULAR | Status: DC | PRN
  Administered 2012-11-26: 4 mg via INTRAVENOUS

## 2012-11-26 MED ORDER — MIDAZOLAM HCL 5 MG/5ML IJ SOLN
INTRAMUSCULAR | Status: DC | PRN
  Administered 2012-11-26: 2 mg via INTRAVENOUS

## 2012-11-26 MED ORDER — SODIUM CHLORIDE 0.9 % IR SOLN
Status: DC | PRN
  Administered 2012-11-26: 3000 mL

## 2012-11-26 MED ORDER — MEPERIDINE HCL 25 MG/ML IJ SOLN: 6.2500 mg | INTRAMUSCULAR | Status: DC | PRN

## 2012-11-26 MED ORDER — FENTANYL CITRATE 0.05 MG/ML IJ SOLN
INTRAMUSCULAR | Status: DC | PRN
  Administered 2012-11-26: 50 ug via INTRAVENOUS
  Administered 2012-11-26: 100 ug via INTRAVENOUS

## 2012-11-26 MED ORDER — GLYCOPYRROLATE 0.2 MG/ML IJ SOLN
INTRAMUSCULAR | Status: AC
  Administered 2012-11-26: 0.2 mg via INTRAVENOUS
  Filled 2012-11-26: qty 1

## 2012-11-26 MED ORDER — PROPOFOL 10 MG/ML IV BOLUS
INTRAVENOUS | Status: DC | PRN
  Administered 2012-11-26: 180 mg via INTRAVENOUS

## 2012-11-26 MED ORDER — MIDAZOLAM HCL 2 MG/2ML IJ SOLN: 0.5000 mg | Freq: Once | INTRAMUSCULAR | Status: DC | PRN

## 2012-11-26 MED ORDER — SCOPOLAMINE 1 MG/3DAYS TD PT72
MEDICATED_PATCH | TRANSDERMAL | Status: AC
  Administered 2012-11-26: 1.5 mg via TRANSDERMAL
  Filled 2012-11-26: qty 1

## 2012-11-26 MED ORDER — SILVER NITRATE-POT NITRATE 75-25 % EX MISC
CUTANEOUS | Status: DC | PRN
  Administered 2012-11-26: 3

## 2012-11-26 MED ORDER — ATROPINE SULFATE 0.1 MG/ML IJ SOLN
INTRAMUSCULAR | Status: AC
  Filled 2012-11-26: qty 10

## 2012-11-26 MED ORDER — ONDANSETRON HCL 4 MG/2ML IJ SOLN
INTRAMUSCULAR | Status: AC
  Filled 2012-11-26: qty 2

## 2012-11-26 MED ORDER — FENTANYL CITRATE 0.05 MG/ML IJ SOLN
INTRAMUSCULAR | Status: AC
  Filled 2012-11-26: qty 5

## 2012-11-26 SURGICAL SUPPLY — 21 items
BLADE INCISOR TRUC PLUS 2.9 (ABLATOR) IMPLANT
CANISTERS HI-FLOW 3000CC (CANNISTER) ×1 IMPLANT
CATH ROBINSON RED A/P 16FR (CATHETERS) ×2 IMPLANT
CLOTH BEACON ORANGE TIMEOUT ST (SAFETY) ×2 IMPLANT
CONTAINER PREFILL 10% NBF 60ML (FORM) ×4 IMPLANT
DRAPE HYSTEROSCOPY (DRAPE) ×2 IMPLANT
DRESSING TELFA 8X3 (GAUZE/BANDAGES/DRESSINGS) ×2 IMPLANT
ELECT REM PT RETURN 9FT ADLT (ELECTROSURGICAL)
ELECTRODE REM PT RTRN 9FT ADLT (ELECTROSURGICAL) IMPLANT
GLOVE BIO SURGEON STRL SZ7.5 (GLOVE) ×2 IMPLANT
GOWN STRL REIN XL XLG (GOWN DISPOSABLE) ×4 IMPLANT
INCISOR TRUC PLUS BLADE 2.9 (ABLATOR) ×2
KIT HYSTEROSCOPY TRUCLEAR (ABLATOR) IMPLANT
MORCELLATOR RECIP TRUCLEAR 4.0 (ABLATOR) ×1 IMPLANT
NDL SPNL 22GX3.5 QUINCKE BK (NEEDLE) IMPLANT
NEEDLE SPNL 22GX3.5 QUINCKE BK (NEEDLE) IMPLANT
PACK VAGINAL MINOR WOMEN LF (CUSTOM PROCEDURE TRAY) ×2 IMPLANT
PAD OB MATERNITY 4.3X12.25 (PERSONAL CARE ITEMS) ×2 IMPLANT
SYR CONTROL 10ML LL (SYRINGE) ×2 IMPLANT
TOWEL OR 17X24 6PK STRL BLUE (TOWEL DISPOSABLE) ×4 IMPLANT
WATER STERILE IRR 1000ML POUR (IV SOLUTION) ×2 IMPLANT

## 2012-11-26 NOTE — Discharge Instructions (Signed)
° °Postoperative Instructions Hysteroscopy D & C ° °Dr. Fontaine and the nursing staff have discussed postoperative instructions with you.  If you have any questions please ask them before you leave the hospital, or call Dr Fontaine’s office at [phone].   ° °We would like to emphasize the following instructions: ° ° °  Call the office to make your follow-up appointment as recommended by Dr Fontaine (usually 1-2 weeks). ° °  You were given a prescription, or one was ordered for you at the pharmacy you designated.  Get that prescription filled and take the medication according to instructions. ° °  You may eat a regular diet, but slowly until you start having bowel movements. ° °  Drink plenty of water daily. ° °  Nothing in the vagina (intercourse, douching, objects of any kind) for two weeks.  When reinitiating intercourse, if it is uncomfortable, stop and make an appointment with Dr Fontaine to be evaluated. ° °  No driving for one to two days until the effects of anesthesia has worn off.  No traveling out of town for several days. ° °  You may shower, but no baths for one week.  Walking up and down stairs is ok.  No heavy lifting, prolonged standing, repeated bending or any “working out” until your post op check. ° °  Rest frequently, listen to your body and do not push yourself and overdo it. ° °  Call if: ° °o Your pain medication does not seem strong enough. °o Worsening pain or abdominal bloating °o Persistent nausea or vomiting °o Difficulty with urination or bowel movements. °o Temperature of 101 degrees or higher. °o Heavy vaginal bleeding.  If your period is due, you may use tampons. °o You have any questions or concerns ° ° °DISCHARGE INSTRUCTIONS: HYSTEROSCOPY / ENDOMETRIAL ABLATION °The following instructions have been prepared to help you care for yourself upon your return home. ° °Personal hygiene: °• Use sanitary pads for vaginal drainage, not tampons. °• Shower the day after your procedure. °•  NO tub baths, pools or Jacuzzis for 2-3 weeks. °• Wipe front to back after using the bathroom. ° °Activity and limitations: °• Do NOT drive or operate any equipment for 24 hours. The effects of anesthesia are still present °and drowsiness may result. °• Do NOT rest in bed all day. °• Walking is encouraged. °• Walk up and down stairs slowly. °• You may resume your normal activity in one to two days or as indicated by your physician. °Sexual activity: NO intercourse for at least 2 weeks after the procedure, or as indicated by your °Doctor. ° °Diet: Eat a light meal as desired this evening. You may resume your usual diet tomorrow. ° °Return to Work: You may resume your work activities in one to two days or as indicated by your °Doctor. ° °What to expect after your surgery: Expect to have vaginal bleeding/discharge for 2-3 days and °spotting for up to 10 days. It is not unusual to have soreness for up to 1-2 weeks. You may have a °slight burning sensation when you urinate for the first day. Mild cramps may continue for a couple of °days. You may have a regular period in 2-6 weeks. ° °Call your doctor for any of the following: °• Excessive vaginal bleeding or clotting, saturating and changing one pad every hour. °• Inability to urinate 6 hours after discharge from hospital. °• Pain not relieved by pain medication. °• Fever of 100.4° F or greater. °•   Unusual vaginal discharge or odor. ° °Return to office _________________Call for an appointment ___________________ °Patient’s signature: ______________________ °Nurse’s signature ________________________ ° °Post Anesthesia Care Unit [phone] °

## 2012-11-26 NOTE — Anesthesia Preprocedure Evaluation (Signed)
Anesthesia Evaluation  Patient identified by MRN, date of birth, ID band Patient awake    Reviewed: Allergy & Precautions, H&P , Patient's Chart, lab work & pertinent test results, reviewed documented beta blocker date and time   Airway Mallampati: II TM Distance: >3 FB Neck ROM: full    Dental no notable dental hx.    Pulmonary  breath sounds clear to auscultation  Pulmonary exam normal       Cardiovascular Rhythm:regular Rate:Normal     Neuro/Psych    GI/Hepatic   Endo/Other    Renal/GU      Musculoskeletal   Abdominal   Peds  Hematology   Anesthesia Other Findings   Reproductive/Obstetrics                           Anesthesia Physical Anesthesia Plan  ASA: II  Anesthesia Plan:    Post-op Pain Management:    Induction: Intravenous  Airway Management Planned: LMA  Additional Equipment:   Intra-op Plan:   Post-operative Plan:   Informed Consent: I have reviewed the patients History and Physical, chart, labs and discussed the procedure including the risks, benefits and alternatives for the proposed anesthesia with the patient or authorized representative who has indicated his/her understanding and acceptance.   Dental Advisory Given and Dental advisory given  Plan Discussed with: CRNA and Surgeon  Anesthesia Plan Comments:         Anesthesia Quick Evaluation  

## 2012-11-26 NOTE — Op Note (Signed)
FUJIKO PICAZO 11-08-1960 [id number]   Post Operative Note   Date of surgery:  11/26/2012  Pre Op Dx:  Irregular bleeding, endometrial polyp  Post Op Dx:  Irregular bleeding, submucous myoma  Procedure:  Hysteroscopy, Truclear submucous myomectomy, D&C  Surgeon:  Dara Lords  Anesthesia:  General  EBL:  Minimal  Distended media discrepancy:  80 cc saline  Complications:  None  Specimen:  #1 endometrial curettings #2 submucous myoma fragments to pathology  Findings: EUA:  External BUS vagina normal. Cervix normal. Uterus anteverted normal size midline mobile. Adnexa without masses   Hysteroscopy:  Large submucous myoma filling the cavity. After resection cavity normal noting thin endometrial pattern. Fundus, anterior posterior uterine surfaces, lower uterine segment, cervical canal, right and left tubal ostia all visualized.  Procedure:  The patient was taken to the operating room having received preoperative antibiotics and Lovenox, underwent general anesthesia, was placed in the low dorsal lithotomy position, received a perineal/vaginal preparation with Betadine solution and the bladder was emptied with an in and out Foley catheterization by nursing personnel. An EUA was performed and the timeout was performed by the surgical team. The patient was draped in the usual fashion. The cervix was visualized with a weighted speculum, anterior lip grasped with a single-tooth tenaculum and a paracervical block using 1% lidocaine was placed, a total of 10 cc. The cervix was gently dilated to admit the smaller Truclear 5.0 hysteroscope and hysteroscopy was performed with findings noted above. Using the small incisor blade initial attempts at resection were made and it was evident that this was not an endometrial polyp but a submucous myoma. At this point the cervix was re\re dilated to admit the larger 8.0 Truclear hysteroscope and using the reciprocating morcellator the submucous myoma was  resected in its entirety to the level of the surrounding endometrium. A sharp curettage was then performed and both specimens were sent separately. Repeat hysteroscopy showed an empty cavity with good distention and no evidence of perforation. The instruments were removed and silver nitrate was applied to the tenaculum sites to achieve ultimate hemostasis. The speculum was removed, the patient received intraoperative Toradol, was awakened without difficulty and taken to the recovery room in good condition having tolerated the procedure well.   Note: This document was prepared with digital dictation and possible smart phrase technology. Any transcriptional errors that result from this process are unintentional.  Dara Lords MD, 8:40 AM 11/26/2012

## 2012-11-26 NOTE — H&P (Signed)
  The patient was examined.  I reviewed the proposed surgery and consent form with the patient.  The dictated history and physical is current and accurate and all questions were answered. The patient is ready to proceed with surgery and has a realistic understanding and expectation for the outcome.   Dara Lords MD, 7:09 AM 11/26/2012

## 2012-11-26 NOTE — Anesthesia Postprocedure Evaluation (Signed)
  Anesthesia Post Note  Patient: Sharon Lopez  Procedure(s) Performed: Procedure(s) (LRB): DILATATION & CURETTAGE/HYSTEROSCOPY WITH TRUECLEAR (N/A)  Anesthesia type: GA  Patient location: PACU  Post pain: Pain level controlled  Post assessment: Post-op Vital signs reviewed  Last Vitals:  Filed Vitals:   11/26/12 0830  BP: 78/46  Pulse: 37  Temp: 36.4 C  Resp: 10    Post vital signs: Reviewed  Level of consciousness: sedated  Complications: No apparent anesthesia complications

## 2012-11-26 NOTE — Transfer of Care (Signed)
Immediate Anesthesia Transfer of Care Note  Patient: Sharon Lopez  Procedure(s) Performed: Procedure(s) with comments: DILATATION & CURETTAGE/HYSTEROSCOPY WITH TRUECLEAR (N/A) - [facility] WILL INSERVICE NURSES  Patient Location: PACU  Anesthesia Type:General  Level of Consciousness: awake, alert  and oriented  Airway & Oxygen Therapy: Patient Spontanous Breathing and Patient connected to nasal cannula oxygen  Post-op Assessment: Report given to PACU RN and Post -op Vital signs reviewed and stable  Post vital signs: stable  Complications: No apparent anesthesia complications

## 2012-11-27 ENCOUNTER — Encounter (HOSPITAL_COMMUNITY): Payer: Self-pay | Admitting: Gynecology

## 2012-11-27 ENCOUNTER — Telehealth: Payer: Self-pay | Admitting: Gynecology

## 2012-11-27 NOTE — Telephone Encounter (Signed)
Called patient in followup of her hysteroscopy D&C, resection of submucous myoma. She's doing well, out shopping. Minimal pain and bleeding. 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 Patient will followup for her postoperative appointment in 2 weeks, sooner if any issues.

## 2012-12-01 ENCOUNTER — Telehealth: Payer: Self-pay | Admitting: *Deleted

## 2012-12-01 NOTE — Telephone Encounter (Signed)
Pt is post op hysteroscopy D & C on 11/26/12, pt said she is still bloated, c/o stomach is sticking out. She is drinking plenty of fluids, having bowl movements, not taking any pain medication, she feels great. She asked if this was normal? Pt is leaving out of town on Thursday for [place] and would to make sure okay before doing so. Any recommendations? Please advise

## 2012-12-01 NOTE — Telephone Encounter (Signed)
I suspect is probably normal and may be related to the IV fluids and anesthesia. If she feels fine, afebrile, is having regular bowel movements, eating without difficulty and voiding without difficulty then I would watch for now. Certainly I would be happy to see her at anytime if she wanted to come in before she leaves.

## 2012-12-01 NOTE — Telephone Encounter (Signed)
Left message for pt to call.

## 2012-12-01 NOTE — Telephone Encounter (Signed)
Pt informed with the below, transferred to front desk. 

## 2012-12-02 ENCOUNTER — Telehealth: Payer: Self-pay

## 2012-12-02 ENCOUNTER — Ambulatory Visit: Payer: Private Health Insurance - Indemnity | Admitting: Gynecology

## 2012-12-02 NOTE — Telephone Encounter (Signed)
Patient informed.  Appt cancelled.

## 2012-12-02 NOTE — Telephone Encounter (Signed)
Patient had scheduled appt with you for today due to abdominal bloating and flying out of town tomorrow but overnight things got better and today the bloating is pretty much gone. She is cancelling appt.  She said she was told no strenuous exercise. She wanted to know specifically about walking for exercise, ie treadmill?  Jogging? Weight resistance training?

## 2012-12-02 NOTE — Telephone Encounter (Signed)
She did start with walking and as long as she does well with that she can move on to whatever she wants to do.

## 2013-04-30 ENCOUNTER — Other Ambulatory Visit: Payer: Self-pay

## 2013-04-30 DIAGNOSIS — Z1231 Encounter for screening mammogram for malignant neoplasm of breast: Secondary | ICD-10-CM

## 2013-05-18 ENCOUNTER — Ambulatory Visit
Admission: RE | Admit: 2013-05-18 | Discharge: 2013-05-18 | Disposition: A | Discharge status: Home / Self Care | Payer: Private Health Insurance - Indemnity | Source: Ambulatory Visit

## 2013-05-18 DIAGNOSIS — Z1231 Encounter for screening mammogram for malignant neoplasm of breast: Secondary | ICD-10-CM

## 2013-09-07 ENCOUNTER — Encounter: Payer: Private Health Insurance - Indemnity | Admitting: Gynecology

## 2013-09-14 ENCOUNTER — Encounter: Payer: Self-pay | Admitting: Gynecology

## 2013-09-14 ENCOUNTER — Ambulatory Visit (INDEPENDENT_AMBULATORY_CARE_PROVIDER_SITE_OTHER): Payer: Private Health Insurance - Indemnity | Admitting: Gynecology

## 2013-09-14 VITALS — BP 116/76 | Ht 66.0 in | Wt 130.0 lb

## 2013-09-14 DIAGNOSIS — N926 Irregular menstruation, unspecified: Secondary | ICD-10-CM

## 2013-09-14 DIAGNOSIS — N951 Menopausal and female climacteric states: Secondary | ICD-10-CM

## 2013-09-14 DIAGNOSIS — Z01419 Encounter for gynecological examination (general) (routine) without abnormal findings: Secondary | ICD-10-CM

## 2013-09-14 LAB — CBC WITH DIFFERENTIAL/PLATELET
Basophils Absolute: 0.1 10*3/uL (ref 0.0–0.1)
Basophils Relative: 1 % (ref 0–1)
EOS ABS: 0.1 10*3/uL (ref 0.0–0.7)
Eosinophils Relative: 2 % (ref 0–5)
HEMATOCRIT: 40.3 % (ref 36.0–46.0)
HEMOGLOBIN: 13.6 g/dL (ref 12.0–15.0)
LYMPHS ABS: 1.9 10*3/uL (ref 0.7–4.0)
Lymphocytes Relative: 29 % (ref 12–46)
MCH: 31.4 pg (ref 26.0–34.0)
MCHC: 33.7 g/dL (ref 30.0–36.0)
MCV: 93.1 fL (ref 78.0–100.0)
MONO ABS: 0.5 10*3/uL (ref 0.1–1.0)
MONOS PCT: 7 % (ref 3–12)
NEUTROS ABS: 4 10*3/uL (ref 1.7–7.7)
NEUTROS PCT: 61 % (ref 43–77)
Platelets: 270 10*3/uL (ref 150–400)
RBC: 4.33 MIL/uL (ref 3.87–5.11)
RDW: 12.8 % (ref 11.5–15.5)
WBC: 6.6 10*3/uL (ref 4.0–10.5)

## 2013-09-14 MED ORDER — PAROXETINE MESYLATE 7.5 MG PO CAPS: 1.0000 | ORAL_CAPSULE | Freq: Every day | ORAL | Status: DC

## 2013-09-14 MED ORDER — CLONAZEPAM 0.5 MG PO TABS: 0.5000 mg | ORAL_TABLET | Freq: Two times a day (BID) | ORAL | Status: DC | PRN

## 2013-09-14 NOTE — Patient Instructions (Signed)
Try Brisdelle medication daily for hot flushes. Let me know if you have any issues with this. Schedule colonoscopy. Followup in one year for annual exam.  You may obtain a copy of any labs that were done today by logging onto MyChart as outlined in the instructions provided with your AVS (after visit summary). The office will not call with normal lab results but certainly if there are any significant abnormalities then we will contact you.   Health Maintenance, Female A healthy lifestyle and preventative care can promote health and wellness.  Maintain regular health, dental, and eye exams.  Eat a healthy diet. Foods like vegetables, fruits, whole grains, low-fat dairy products, and lean protein foods contain the nutrients you need without too many calories. Decrease your intake of foods high in solid fats, added sugars, and salt. Get information about a proper diet from your caregiver, if necessary.  Regular physical exercise is one of the most important things you can do for your health. Most adults should get at least 150 minutes of moderate-intensity exercise (any activity that increases your heart rate and causes you to sweat) each week. In addition, most adults need muscle-strengthening exercises on 2 or more days a week.   Maintain a healthy weight. The body mass index (BMI) is a screening tool to identify possible weight problems. It provides an estimate of body fat based on height and weight. Your caregiver can help determine your BMI, and can help you achieve or maintain a healthy weight. For adults 20 years and older:  A BMI below 18.5 is considered underweight.  A BMI of 18.5 to 24.9 is normal.  A BMI of 25 to 29.9 is considered overweight.  A BMI of 30 and above is considered obese.  Maintain normal blood lipids and cholesterol by exercising and minimizing your intake of saturated fat. Eat a balanced diet with plenty of fruits and vegetables. Blood tests for lipids and  cholesterol should begin at age 42 and be repeated every 5 years. If your lipid or cholesterol levels are high, you are over 50, or you are a high risk for heart disease, you may need your cholesterol levels checked more frequently.Ongoing high lipid and cholesterol levels should be treated with medicines if diet and exercise are not effective.  If you smoke, find out from your caregiver how to quit. If you do not use tobacco, do not start.  Lung cancer screening is recommended for adults aged 3 80 years who are at high risk for developing lung cancer because of a history of smoking. Yearly low-dose computed tomography (CT) is recommended for people who have at least a 30-pack-year history of smoking and are a current smoker or have quit within the past 15 years. A pack year of smoking is smoking an average of 1 pack of cigarettes a day for 1 year (for example: 1 pack a day for 30 years or 2 packs a day for 15 years). Yearly screening should continue until the smoker has stopped smoking for at least 15 years. Yearly screening should also be stopped for people who develop a health problem that would prevent them from having lung cancer treatment.  If you are pregnant, do not drink alcohol. If you are breastfeeding, be very cautious about drinking alcohol. If you are not pregnant and choose to drink alcohol, do not exceed 1 drink per day. One drink is considered to be 12 ounces (355 mL) of beer, 5 ounces (148 mL) of wine, or 1.5 ounces (  44 mL) of liquor.  Avoid use of street drugs. Do not share needles with anyone. Ask for help if you need support or instructions about stopping the use of drugs.  High blood pressure causes heart disease and increases the risk of stroke. Blood pressure should be checked at least every 1 to 2 years. Ongoing high blood pressure should be treated with medicines, if weight loss and exercise are not effective.  If you are 60 to 51 years old, ask your caregiver if you should  take aspirin to prevent strokes.  Diabetes screening involves taking a blood sample to check your fasting blood sugar level. This should be done once every 3 years, after age 3, if you are within normal weight and without risk factors for diabetes. Testing should be considered at a younger age or be carried out more frequently if you are overweight and have at least 1 risk factor for diabetes.  Breast cancer screening is essential preventative care for women. You should practice "breast self-awareness." This means understanding the normal appearance and feel of your breasts and may include breast self-examination. Any changes detected, no matter how small, should be reported to a caregiver. Women in their 41s and 30s should have a clinical breast exam (CBE) by a caregiver as part of a regular health exam every 1 to 3 years. After age 83, women should have a CBE every year. Starting at age 38, women should consider having a mammogram (breast X-ray) every year. Women who have a family history of breast cancer should talk to their caregiver about genetic screening. Women at a high risk of breast cancer should talk to their caregiver about having an MRI and a mammogram every year.  Breast cancer gene (BRCA)-related cancer risk assessment is recommended for women who have family members with BRCA-related cancers. BRCA-related cancers include breast, ovarian, tubal, and peritoneal cancers. Having family members with these cancers may be associated with an increased risk for harmful changes (mutations) in the breast cancer genes BRCA1 and BRCA2. Results of the assessment will determine the need for genetic counseling and BRCA1 and BRCA2 testing.  The Pap test is a screening test for cervical cancer. Women should have a Pap test starting at age 11. Between ages 51 and 73, Pap tests should be repeated every 2 years. Beginning at age 77, you should have a Pap test every 3 years as long as the past 3 Pap tests have  been normal. If you had a hysterectomy for a problem that was not cancer or a condition that could lead to cancer, then you no longer need Pap tests. If you are between ages 78 and 61, and you have had normal Pap tests going back 10 years, you no longer need Pap tests. If you have had past treatment for cervical cancer or a condition that could lead to cancer, you need Pap tests and screening for cancer for at least 20 years after your treatment. If Pap tests have been discontinued, risk factors (such as a new sexual partner) need to be reassessed to determine if screening should be resumed. Some women have medical problems that increase the chance of getting cervical cancer. In these cases, your caregiver may recommend more frequent screening and Pap tests.  The human papillomavirus (HPV) test is an additional test that may be used for cervical cancer screening. The HPV test looks for the virus that can cause the cell changes on the cervix. The cells collected during the Pap test can  be tested for HPV. The HPV test could be used to screen women aged 25 years and older, and should be used in women of any age who have unclear Pap test results. After the age of 6, women should have HPV testing at the same frequency as a Pap test.  Colorectal cancer can be detected and often prevented. Most routine colorectal cancer screening begins at the age of 23 and continues through age 25. However, your caregiver may recommend screening at an earlier age if you have risk factors for colon cancer. On a yearly basis, your caregiver may provide home test kits to check for hidden blood in the stool. Use of a small camera at the end of a tube, to directly examine the colon (sigmoidoscopy or colonoscopy), can detect the earliest forms of colorectal cancer. Talk to your caregiver about this at age 20, when routine screening begins. Direct examination of the colon should be repeated every 5 to 10 years through age 72, unless early  forms of pre-cancerous polyps or small growths are found.  Hepatitis C blood testing is recommended for all people born from [birth year] through 1963 and any individual with known risks for hepatitis C.  Practice safe sex. Use condoms and avoid high-risk sexual practices to reduce the spread of sexually transmitted infections (STIs). Sexually active women aged 24 and younger should be checked for Chlamydia, which is a common sexually transmitted infection. Older women with new or multiple partners should also be tested for Chlamydia. Testing for other STIs is recommended if you are sexually active and at increased risk.  Osteoporosis is a disease in which the bones lose minerals and strength with aging. This can result in serious bone fractures. The risk of osteoporosis can be identified using a bone density scan. Women ages 49 and over and women at risk for fractures or osteoporosis should discuss screening with their caregivers. Ask your caregiver whether you should be taking a calcium supplement or vitamin D to reduce the rate of osteoporosis.  Menopause can be associated with physical symptoms and risks. Hormone replacement therapy is available to decrease symptoms and risks. You should talk to your caregiver about whether hormone replacement therapy is right for you.  Use sunscreen. Apply sunscreen liberally and repeatedly throughout the day. You should seek shade when your shadow is shorter than you. Protect yourself by wearing long sleeves, pants, a wide-brimmed hat, and sunglasses year round, whenever you are outdoors.  Notify your caregiver of new moles or changes in moles, especially if there is a change in shape or color. Also notify your caregiver if a mole is larger than the size of a pencil eraser.  Stay current with your immunizations. Document Released: 10/08/2010 Document Revised: 07/20/2012 Document Reviewed: 10/08/2010 [facility] Patient Information 2014 [place].

## 2013-09-14 NOTE — Progress Notes (Signed)
Sharon Lopez [birth year] [id number]        53 y.o.  G3P3003 for annual exam.  Several issues noted below.  Past medical history,surgical history, problem list, medications, allergies, family history and social history were all reviewed and documented as reviewed in the EPIC chart.  ROS:  12 system ROS performed with pertinent positives and negatives included in the history, assessment and plan.  Included Systems: General, HEENT, Neck, Cardiovascular, Pulmonary, Gastrointestinal, Genitourinary, Musculoskeletal, Dermatologic, Endocrine, Hematological, Neurologic, Psychiatric Additional significant findings :  None   Exam: Kim assistant Filed Vitals:   09/14/13 1431  BP: 116/76  Height: 5\' 6"  (1.676 m)  Weight: 130 lb (58.968 kg)   General appearance:  Normal affect, orientation and appearance. Skin: Grossly normal HEENT: Without gross lesions.  No cervical or supraclavicular adenopathy. Thyroid normal.  Lungs:  Clear without wheezing, rales or rhonchi Cardiac: RR, without RMG Abdominal:  Soft, nontender, without masses, guarding, rebound, organomegaly or hernia Breasts:  Examined lying and sitting without masses, retractions, discharge or axillary adenopathy. Pelvic:  Ext/BUS/vagina normal  Cervix normal  Uterus anteverted, normal size, shape and contour, midline and mobile nontender   Adnexa  Without masses or tenderness    Anus and perineum  Normal   Rectovaginal  Normal sphincter tone without palpated masses or tenderness.    Assessment/Plan:  53 y.o. [id number] female for annual exam.   1. Perimenopausal. LMP 06/2013. Has had skips by several months.  Developing more hot flushes and night sweats. No prolonged or atypical bleeding. 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 Options for management of her symptoms reviewed to include trial of low-dose estrogen possible patch noting increased risk of thrombosis given her history. Alternatives such as Brisdelle or Effexor as well as OTC  products. After lengthy discussion patient wants to try Marlow. Reviewed side effects potential to include worsening anxiety or depression the need to report to [place] ASAP. #30 with 12 refills provided. Patient will keep menstrual calendar as long as less frequent but regular menses will follow. If prolonged or atypical leading and she knows to report this. 2. Contraception. Patient continues using coitus interruptus. We've discussed on multiple occasions the failure risk with this and she clearly understands and accepts this. 3. Insomnia/anxiety. Patient has occasional episodes. I had prescribed Klonopin previously but she never filled the prescription. I wrote for clonazepam 0.5 mg #30 with 2 refills to have available as needed. 4. Mammogram 05/2013. Continue with annual mammography. SBE monthly reviewed. 5. Pap smear/HPV negative 08/2012. No Pap smear done today. We'll plan repeat at 3-5 year interval for current screening guidelines. 6. Colonoscopy never. Patient knows the need to schedule this over this coming year and agrees to do so. 7. Health maintenance. Baseline CBC comprehensive metabolic panel lipid profile urinalysis TSH and vitamin D ordered. Followup in one year, sooner as needed.   Note: This document was prepared with digital dictation and possible smart phrase technology. Any transcriptional errors that result from this process are unintentional.   Anastasio Auerbach MD, 3:35 PM 09/14/2013

## 2013-09-15 LAB — COMPREHENSIVE METABOLIC PANEL
ALT: 15 U/L (ref 0–35)
AST: 17 U/L (ref 0–37)
Albumin: 4.9 g/dL (ref 3.5–5.2)
Alkaline Phosphatase: 20 U/L — ABNORMAL LOW (ref 39–117)
BUN: 17 mg/dL (ref 6–23)
CO2: 28 mEq/L (ref 19–32)
Calcium: 9.6 mg/dL (ref 8.4–10.5)
Chloride: 100 mEq/L (ref 96–112)
Creat: 1.02 mg/dL (ref 0.50–1.10)
Glucose, Bld: 104 mg/dL — ABNORMAL HIGH (ref 70–99)
Potassium: 4 mEq/L (ref 3.5–5.3)
Sodium: 140 mEq/L (ref 135–145)
Total Bilirubin: 0.6 mg/dL (ref 0.2–1.2)
Total Protein: 6.9 g/dL (ref 6.0–8.3)

## 2013-09-15 LAB — LIPID PANEL
CHOLESTEROL: 203 mg/dL — AB (ref 0–200)
HDL: 106 mg/dL (ref >39)
LDL Cholesterol: 85 mg/dL (ref 0–99)
TRIGLYCERIDES: 59 mg/dL (ref <150)
Total CHOL/HDL Ratio: 1.9 Ratio
VLDL: 12 mg/dL (ref 0–40)

## 2013-09-15 LAB — URINALYSIS W MICROSCOPIC + REFLEX CULTURE
Bacteria, UA: NONE SEEN
Bilirubin Urine: NEGATIVE
Casts: NONE SEEN
Crystals: NONE SEEN
Glucose, UA: NEGATIVE mg/dL
Hgb urine dipstick: NEGATIVE
Ketones, ur: NEGATIVE mg/dL
Leukocytes, UA: NEGATIVE
Nitrite: NEGATIVE
Protein, ur: NEGATIVE mg/dL
Specific Gravity, Urine: 1.022 (ref 1.005–1.030)
Urobilinogen, UA: 0.2 mg/dL (ref 0.0–1.0)
pH: 7.5 (ref 5.0–8.0)

## 2013-09-15 LAB — VITAMIN D 25 HYDROXY (VIT D DEFICIENCY, FRACTURES): Vit D, 25-Hydroxy: 59 ng/mL (ref 30–89)

## 2013-09-15 LAB — TSH: TSH: 1.968 u[IU]/mL (ref 0.350–4.500)

## 2014-02-07 ENCOUNTER — Encounter: Payer: Self-pay | Admitting: Gynecology

## 2014-05-26 ENCOUNTER — Other Ambulatory Visit: Payer: Self-pay

## 2014-05-26 DIAGNOSIS — Z1231 Encounter for screening mammogram for malignant neoplasm of breast: Secondary | ICD-10-CM

## 2014-06-01 ENCOUNTER — Ambulatory Visit
Admission: RE | Admit: 2014-06-01 | Discharge: 2014-06-01 | Disposition: A | Discharge status: Home / Self Care | Payer: 59 | Source: Ambulatory Visit

## 2014-06-01 DIAGNOSIS — Z1231 Encounter for screening mammogram for malignant neoplasm of breast: Secondary | ICD-10-CM

## 2014-06-14 ENCOUNTER — Ambulatory Visit (AMBULATORY_SURGERY_CENTER): Payer: Self-pay | Admitting: *Deleted

## 2014-06-14 VITALS — Ht 66.0 in | Wt 136.6 lb

## 2014-06-14 DIAGNOSIS — Z1211 Encounter for screening for malignant neoplasm of colon: Secondary | ICD-10-CM

## 2014-06-14 MED ORDER — MOVIPREP 100 G PO SOLR: 1.0000 | Freq: Once | ORAL | Status: DC

## 2014-06-14 NOTE — Progress Notes (Signed)
Denies allergies to eggs or soy products. Denies complications with sedation or anesthesia. Denies O2 use. Denies use of diet or weight loss medications.  Emmi instructions given for colonoscopy.  

## 2014-07-07 ENCOUNTER — Encounter: Payer: Self-pay | Admitting: Internal Medicine

## 2014-07-07 ENCOUNTER — Ambulatory Visit (AMBULATORY_SURGERY_CENTER): Payer: Managed Care, Other (non HMO) | Admitting: Internal Medicine

## 2014-07-07 VITALS — BP 135/80 | HR 50 | Temp 96.8°F | Resp 15 | Ht 66.0 in | Wt 136.0 lb

## 2014-07-07 DIAGNOSIS — Z1211 Encounter for screening for malignant neoplasm of colon: Secondary | ICD-10-CM

## 2014-07-07 MED ORDER — SODIUM CHLORIDE 0.9 % IV SOLN: 500.0000 mL | INTRAVENOUS | Status: DC

## 2014-07-07 NOTE — Op Note (Signed)
[place]  [address] [place], [address]   COLONOSCOPY PROCEDURE REPORT  PATIENT: Sharon Lopez, Sharon Lopez  MR#: [id number] BIRTHDATE: 1960-05-08 , 53  yrs. old GENDER: female ENDOSCOPIST: Eustace Quail, MD REFERRED BY:.Direct Self PROCEDURE DATE:  07/07/2014 PROCEDURE:   Colonoscopy, screening First Screening Colonoscopy - Avg.  risk and is 50 yrs.  old or older Yes.  Prior Negative Screening - Now for repeat screening. N/A  History of Adenoma - Now for follow-up colonoscopy & has been > or = to 3 yrs.  N/A ASA CLASS:   Class I INDICATIONS:Screening for colonic neoplasia and Colorectal Neoplasm Risk Assessment for this procedure is average risk. MEDICATIONS: Monitored anesthesia care and Propofol 340 mg IV  DESCRIPTION OF PROCEDURE:   After the risks benefits and alternatives of the procedure were thoroughly explained, informed consent was obtained.  The digital rectal exam revealed no abnormalities of the rectum.   The LB [id number]  endoscope was introduced through the anus and advanced to the cecum, which was identified by both the appendix and ileocecal valve. No adverse events experienced.   The quality of the prep was excellent. (MoviPrep was used)  The instrument was then slowly withdrawn as the colon was fully examined.      COLON FINDINGS: A normal appearing cecum, ileocecal valve, and appendiceal orifice were identified.  The ascending, transverse, descending, sigmoid colon, and rectum appeared unremarkable. Retroflexed views revealed no abnormalities. The time to cecum = 3.2 Withdrawal time = 17.3   The scope was withdrawn and the procedure completed.  COMPLICATIONS: There were no immediate complications.  ENDOSCOPIC IMPRESSION: 1. Normal colonoscopy  RECOMMENDATIONS: 1. Continue current colorectal screening recommendations for "routine risk" patients with a repeat colonoscopy in 10 years.  eSigned:  Eustace Quail, MD 07/07/2014  1:57 PM   cc: The Patient, Lovette Cliche, MD, and Donalynn Furlong, MD

## 2014-07-07 NOTE — Progress Notes (Signed)
Procedure ends, to recovery, report to Nyra Capes, RN. VSS.

## 2014-07-07 NOTE — Patient Instructions (Signed)
YOU HAD AN ENDOSCOPIC PROCEDURE TODAY AT THE [facility]:   Refer to the procedure report that was given to you for any specific questions about what was found during the examination.  If the procedure report does not answer your questions, please call your gastroenterologist to clarify.  If you requested that your care partner not be given the details of your procedure findings, then the procedure report has been included in a sealed envelope for you to review at your convenience later.  YOU SHOULD EXPECT: Some feelings of bloating in the abdomen. Passage of more gas than usual.  Walking can help get rid of the air that was put into your GI tract during the procedure and reduce the bloating. If you had a lower endoscopy (such as a colonoscopy or flexible sigmoidoscopy) you may notice spotting of blood in your stool or on the toilet paper. If you underwent a bowel prep for your procedure, you may not have a normal bowel movement for a few days.  Please Note:  You might notice some irritation and congestion in your nose or some drainage.  This is from the oxygen used during your procedure.  There is no need for concern and it should clear up in a day or so.  SYMPTOMS TO REPORT IMMEDIATELY:   Following lower endoscopy (colonoscopy or flexible sigmoidoscopy):  Excessive amounts of blood in the stool  Significant tenderness or worsening of abdominal pains  Swelling of the abdomen that is new, acute  Fever of 100F or higher   For urgent or emergent issues, a gastroenterologist can be reached at any hour by calling [phone].   DIET: Your first meal following the procedure should be a small meal and then it is ok to progress to your normal diet. Heavy or fried foods are harder to digest and may make you feel nauseous or bloated.  Likewise, meals heavy in dairy and vegetables can increase bloating.  Drink plenty of fluids but you should avoid alcoholic beverages for 24  hours.  ACTIVITY:  You should plan to take it easy for the rest of today and you should NOT DRIVE or use heavy machinery until tomorrow (because of the sedation medicines used during the test).    FOLLOW UP: Our staff will call the number listed on your records the next business day following your procedure to check on you and address any questions or concerns that you may have regarding the information given to you following your procedure. If we do not reach you, we will leave a message.  However, if you are feeling well and you are not experiencing any problems, there is no need to return our call.  We will assume that you have returned to your regular daily activities without incident.  If any biopsies were taken you will be contacted by phone or by letter within the next 1-3 weeks.  Please call us at [phone] if you have not heard about the biopsies in 3 weeks.    SIGNATURES/CONFIDENTIALITY: You and/or your care partner have signed paperwork which will be entered into your electronic medical record.  These signatures attest to the fact that that the information above on your After Visit Summary has been reviewed and is understood.  Full responsibility of the confidentiality of this discharge information lies with you and/or your care-partner. 

## 2014-07-08 ENCOUNTER — Telehealth: Payer: Self-pay | Admitting: *Deleted

## 2014-07-08 NOTE — Telephone Encounter (Signed)
No answer. Number identifier. Message left to call if questions or concerns. 

## 2015-05-25 ENCOUNTER — Other Ambulatory Visit: Payer: Self-pay

## 2015-05-25 DIAGNOSIS — Z1231 Encounter for screening mammogram for malignant neoplasm of breast: Secondary | ICD-10-CM

## 2015-06-08 ENCOUNTER — Ambulatory Visit: Payer: Managed Care, Other (non HMO)

## 2015-06-19 ENCOUNTER — Ambulatory Visit: Payer: Managed Care, Other (non HMO)

## 2015-06-21 ENCOUNTER — Ambulatory Visit
Admission: RE | Admit: 2015-06-21 | Discharge: 2015-06-21 | Disposition: A | Discharge status: Home / Self Care | Payer: Managed Care, Other (non HMO) | Source: Ambulatory Visit

## 2015-06-21 DIAGNOSIS — Z1231 Encounter for screening mammogram for malignant neoplasm of breast: Secondary | ICD-10-CM

## 2015-08-21 ENCOUNTER — Encounter: Payer: Self-pay | Admitting: Gynecology

## 2015-08-21 ENCOUNTER — Ambulatory Visit (INDEPENDENT_AMBULATORY_CARE_PROVIDER_SITE_OTHER): Payer: Managed Care, Other (non HMO) | Admitting: Gynecology

## 2015-08-21 VITALS — BP 110/64 | Ht 66.0 in | Wt 138.0 lb

## 2015-08-21 DIAGNOSIS — R5383 Other fatigue: Secondary | ICD-10-CM | POA: Diagnosis not present

## 2015-08-21 DIAGNOSIS — Z01419 Encounter for gynecological examination (general) (routine) without abnormal findings: Secondary | ICD-10-CM | POA: Diagnosis not present

## 2015-08-21 DIAGNOSIS — N952 Postmenopausal atrophic vaginitis: Secondary | ICD-10-CM | POA: Diagnosis not present

## 2015-08-21 DIAGNOSIS — Z13220 Encounter for screening for lipoid disorders: Secondary | ICD-10-CM

## 2015-08-21 LAB — CBC WITH DIFFERENTIAL/PLATELET
BASOS ABS: 63 {cells}/uL (ref 0–200)
Basophils Relative: 1 %
EOS PCT: 4 %
Eosinophils Absolute: 252 cells/uL (ref 15–500)
HCT: 38.8 % (ref 35.0–45.0)
Hemoglobin: 12.7 g/dL (ref 11.7–15.5)
LYMPHS PCT: 27 %
Lymphs Abs: 1701 cells/uL (ref 850–3900)
MCH: 31.5 pg (ref 27.0–33.0)
MCHC: 32.7 g/dL (ref 32.0–36.0)
MCV: 96.3 fL (ref 80.0–100.0)
MONOS PCT: 7 %
MPV: 9.9 fL (ref 7.5–12.5)
Monocytes Absolute: 441 cells/uL (ref 200–950)
NEUTROS PCT: 61 %
Neutro Abs: 3843 cells/uL (ref 1500–7800)
PLATELETS: 244 10*3/uL (ref 140–400)
RBC: 4.03 MIL/uL (ref 3.80–5.10)
RDW: 13 % (ref 11.0–15.0)
WBC: 6.3 10*3/uL (ref 3.8–10.8)

## 2015-08-21 LAB — LIPID PANEL
Cholesterol: 167 mg/dL (ref 125–200)
HDL: 89 mg/dL (ref >=46)
LDL CALC: 66 mg/dL (ref <130)
TRIGLYCERIDES: 61 mg/dL (ref <150)
Total CHOL/HDL Ratio: 1.9 Ratio (ref <=5.0)
VLDL: 12 mg/dL (ref <30)

## 2015-08-21 LAB — COMPREHENSIVE METABOLIC PANEL
ALBUMIN: 4.3 g/dL (ref 3.6–5.1)
ALT: 11 U/L (ref 6–29)
AST: 16 U/L (ref 10–35)
Alkaline Phosphatase: 22 U/L — ABNORMAL LOW (ref 33–130)
BUN: 18 mg/dL (ref 7–25)
CHLORIDE: 103 mmol/L (ref 98–110)
CO2: 26 mmol/L (ref 20–31)
CREATININE: 1.07 mg/dL — AB (ref 0.50–1.05)
Calcium: 9.5 mg/dL (ref 8.6–10.4)
Glucose, Bld: 101 mg/dL — ABNORMAL HIGH (ref 65–99)
Potassium: 3.8 mmol/L (ref 3.5–5.3)
SODIUM: 143 mmol/L (ref 135–146)
Total Bilirubin: 0.8 mg/dL (ref 0.2–1.2)
Total Protein: 6.3 g/dL (ref 6.1–8.1)

## 2015-08-21 LAB — THYROID PANEL
Free T4: 1 ng/dL (ref 0.8–1.8)
T3 Uptake: 34 % (ref 22–35)
T4, Total: 4.9 ug/dL (ref 4.5–12.0)
TSH: 2.05 m[IU]/L

## 2015-08-21 NOTE — Patient Instructions (Signed)

## 2015-08-21 NOTE — Progress Notes (Signed)
    Sharon Lopez 09-May-1960 [id number]        55 y.o.  G3P3003  for annual exam.  Doing well overall. Does note some fatigue and insomnia which is been a chronic problem with her.  Past medical history,surgical history, problem list, medications, allergies, family history and social history were all reviewed and documented as reviewed in the EPIC chart.  ROS:  Performed with pertinent positives and negatives included in the history, assessment and plan.   Additional significant findings :  none   Exam: Caryn Bee assistant Filed Vitals:   08/21/15 1520  BP: 110/64  Height: 5\' 6"  (1.676 m)  Weight: 138 lb (62.596 kg)   General appearance:  Normal affect, orientation and appearance. Skin: Grossly normal HEENT: Without gross lesions.  No cervical or supraclavicular adenopathy. Thyroid normal.  Lungs:  Clear without wheezing, rales or rhonchi Cardiac: RR, without RMG Abdominal:  Soft, nontender, without masses, guarding, rebound, organomegaly or hernia Breasts:  Examined lying and sitting without masses, retractions, discharge or axillary adenopathy. Pelvic:  Ext/BUS/vagina normal  Cervix normal.  Uterus anteverted, normal size, shape and contour, midline and mobile nontender   Adnexa without masses or tenderness    Anus and perineum normal   Rectovaginal normal sphincter tone without palpated masses or tenderness.    Assessment/Plan:  55 y.o. G51P3003 female for annual exam.   1. Postmenopausal. Patient is approaching 2 years without menses.  No bleeding at all. Not having significant hot flashes or night sweats. Will continue to monitor and report any issues or bleeding. 2. Fatigue. Patient notes generalized fatigue despite exercising. Also some insomnia. Husband being treated for low B-12. We'll check baseline CBC comprehensive metabolic panel and thyroid panel and vitamin D and B-12 level. 3. Mammography 06/2015. Continue with annual mammography when due. SBE monthly  reviewed. 4. Pap smear/HPV 08/2012 negative. No Pap smear done today. No history of significant abnormal Pap smears previously. Plan repeat Pap smear approaching 5 year interval per current screening guidelines. 5. Colonoscopy 2016. Repeat at their recommended interval. 6. Health maintenance. Baseline CBC, CMP, lipid profile, urinalysis, thyroid panel, vitamin D, B-12 ordered. Follow up 1 year, sooner as needed.   Anastasio Auerbach MD, 3:57 PM 08/21/2015

## 2015-08-22 LAB — URINALYSIS W MICROSCOPIC + REFLEX CULTURE
BACTERIA UA: NONE SEEN [HPF]
BILIRUBIN URINE: NEGATIVE
Casts: NONE SEEN [LPF]
Crystals: NONE SEEN [HPF]
GLUCOSE, UA: NEGATIVE
HGB URINE DIPSTICK: NEGATIVE
Ketones, ur: NEGATIVE
Nitrite: NEGATIVE
PROTEIN: NEGATIVE
RBC / HPF: NONE SEEN RBC/HPF (ref <=2)
Specific Gravity, Urine: 1.015 (ref 1.001–1.035)
Yeast: NONE SEEN [HPF]
pH: 5.5 (ref 5.0–8.0)

## 2015-08-22 LAB — VITAMIN D 25 HYDROXY (VIT D DEFICIENCY, FRACTURES): Vit D, 25-Hydroxy: 44 ng/mL (ref 30–100)

## 2015-08-23 LAB — URINE CULTURE
COLONY COUNT: NO GROWTH
Organism ID, Bacteria: NO GROWTH

## 2015-08-31 ENCOUNTER — Other Ambulatory Visit: Payer: Self-pay | Admitting: Gynecology

## 2015-08-31 DIAGNOSIS — R5383 Other fatigue: Secondary | ICD-10-CM

## 2015-08-31 DIAGNOSIS — R748 Abnormal levels of other serum enzymes: Secondary | ICD-10-CM

## 2015-08-31 DIAGNOSIS — R7309 Other abnormal glucose: Secondary | ICD-10-CM

## 2015-08-31 DIAGNOSIS — R7989 Other specified abnormal findings of blood chemistry: Secondary | ICD-10-CM

## 2015-09-08 ENCOUNTER — Other Ambulatory Visit: Payer: Managed Care, Other (non HMO)

## 2015-09-08 DIAGNOSIS — R7989 Other specified abnormal findings of blood chemistry: Secondary | ICD-10-CM

## 2015-09-08 DIAGNOSIS — R748 Abnormal levels of other serum enzymes: Secondary | ICD-10-CM

## 2015-09-08 DIAGNOSIS — R5383 Other fatigue: Secondary | ICD-10-CM

## 2015-09-08 LAB — COMPREHENSIVE METABOLIC PANEL
ALBUMIN: 4.5 g/dL (ref 3.6–5.1)
ALK PHOS: 22 U/L — AB (ref 33–130)
ALT: 14 U/L (ref 6–29)
AST: 17 U/L (ref 10–35)
BILIRUBIN TOTAL: 0.5 mg/dL (ref 0.2–1.2)
BUN: 22 mg/dL (ref 7–25)
CO2: 28 mmol/L (ref 20–31)
CREATININE: 0.95 mg/dL (ref 0.50–1.05)
Calcium: 9 mg/dL (ref 8.6–10.4)
Chloride: 104 mmol/L (ref 98–110)
Glucose, Bld: 83 mg/dL (ref 65–99)
Potassium: 4.1 mmol/L (ref 3.5–5.3)
SODIUM: 142 mmol/L (ref 135–146)
TOTAL PROTEIN: 6.4 g/dL (ref 6.1–8.1)

## 2015-09-08 LAB — VITAMIN B12: VITAMIN B 12: 379 pg/mL (ref 200–1100)

## 2015-09-08 LAB — HEMOGLOBIN A1C
Hgb A1c MFr Bld: 5.3 % (ref <5.7)
Mean Plasma Glucose: 105 mg/dL

## 2016-08-27 ENCOUNTER — Other Ambulatory Visit: Payer: Self-pay | Admitting: Gynecology

## 2016-08-27 DIAGNOSIS — Z1231 Encounter for screening mammogram for malignant neoplasm of breast: Secondary | ICD-10-CM

## 2016-08-28 ENCOUNTER — Ambulatory Visit
Admission: RE | Admit: 2016-08-28 | Discharge: 2016-08-28 | Disposition: A | Discharge status: Home / Self Care | Payer: Managed Care, Other (non HMO) | Source: Ambulatory Visit | Attending: Gynecology | Admitting: Gynecology

## 2016-08-28 DIAGNOSIS — Z1231 Encounter for screening mammogram for malignant neoplasm of breast: Secondary | ICD-10-CM

## 2016-09-03 ENCOUNTER — Ambulatory Visit (INDEPENDENT_AMBULATORY_CARE_PROVIDER_SITE_OTHER): Payer: Managed Care, Other (non HMO) | Admitting: Gynecology

## 2016-09-03 ENCOUNTER — Encounter: Payer: Self-pay | Admitting: Gynecology

## 2016-09-03 VITALS — BP 118/76 | Ht 66.0 in | Wt 137.0 lb

## 2016-09-03 DIAGNOSIS — Z1151 Encounter for screening for human papillomavirus (HPV): Secondary | ICD-10-CM | POA: Diagnosis not present

## 2016-09-03 DIAGNOSIS — Z01419 Encounter for gynecological examination (general) (routine) without abnormal findings: Secondary | ICD-10-CM | POA: Diagnosis not present

## 2016-09-03 LAB — COMPREHENSIVE METABOLIC PANEL
ALT: 13 U/L (ref 6–29)
AST: 15 U/L (ref 10–35)
Albumin: 4.5 g/dL (ref 3.6–5.1)
Alkaline Phosphatase: 25 U/L — ABNORMAL LOW (ref 33–130)
BUN: 20 mg/dL (ref 7–25)
CO2: 24 mmol/L (ref 20–31)
Calcium: 9.7 mg/dL (ref 8.6–10.4)
Chloride: 104 mmol/L (ref 98–110)
Creat: 1.05 mg/dL (ref 0.50–1.05)
Glucose, Bld: 85 mg/dL (ref 65–99)
Potassium: 4.1 mmol/L (ref 3.5–5.3)
Sodium: 142 mmol/L (ref 135–146)
TOTAL PROTEIN: 6.6 g/dL (ref 6.1–8.1)
Total Bilirubin: 0.8 mg/dL (ref 0.2–1.2)

## 2016-09-03 LAB — CBC WITH DIFFERENTIAL/PLATELET
Basophils Absolute: 62 cells/uL (ref 0–200)
Basophils Relative: 1 %
EOS ABS: 248 {cells}/uL (ref 15–500)
Eosinophils Relative: 4 %
HCT: 39.8 % (ref 35.0–45.0)
Hemoglobin: 12.8 g/dL (ref 11.7–15.5)
Lymphocytes Relative: 24 %
Lymphs Abs: 1488 cells/uL (ref 850–3900)
MCH: 31.4 pg (ref 27.0–33.0)
MCHC: 32.2 g/dL (ref 32.0–36.0)
MCV: 97.8 fL (ref 80.0–100.0)
MONOS PCT: 8 %
MPV: 10.3 fL (ref 7.5–12.5)
Monocytes Absolute: 496 cells/uL (ref 200–950)
NEUTROS ABS: 3906 {cells}/uL (ref 1500–7800)
Neutrophils Relative %: 63 %
PLATELETS: 267 10*3/uL (ref 140–400)
RBC: 4.07 MIL/uL (ref 3.80–5.10)
RDW: 13.4 % (ref 11.0–15.0)
WBC: 6.2 10*3/uL (ref 3.8–10.8)

## 2016-09-03 NOTE — Addendum Note (Signed)
Addended by: Nelva Nay on: 09/03/2016 12:38 PM   Modules accepted: Orders

## 2016-09-03 NOTE — Patient Instructions (Signed)
Follow up in one year for annual exam 

## 2016-09-03 NOTE — Progress Notes (Signed)
    Sharon Lopez 03/24/1961 [id number]        56 y.o.  G3P3003 for annual exam.    Past medical history,surgical history, problem list, medications, allergies, family history and social history were all reviewed and documented as reviewed in the EPIC chart.  ROS:  Performed with pertinent positives and negatives included in the history, assessment and plan.   Additional significant findings :  None   Exam: Caryn Bee assistant Vitals:   09/03/16 1131  BP: 118/76  Weight: 137 lb (62.1 kg)  Height: 5\' 6"  (1.676 m)   Body mass index is 22.11 kg/m.  General appearance:  Normal affect, orientation and appearance. Skin: Grossly normal HEENT: Without gross lesions.  No cervical or supraclavicular adenopathy. Thyroid normal.  Lungs:  Clear without wheezing, rales or rhonchi Cardiac: RR, without RMG Abdominal:  Soft, nontender, without masses, guarding, rebound, organomegaly or hernia Breasts:  Examined lying and sitting without masses, retractions, discharge or axillary adenopathy. Pelvic:  Ext, BUS, Vagina: With atrophic changes  Cervix: With atrophic changes  Uterus: Anteverted, normal size, shape and contour, midline and mobile nontender   Adnexa: Without masses or tenderness    Anus and perineum: Normal   Rectovaginal: Normal sphincter tone without palpated masses or tenderness.    Assessment/Plan:  56 y.o. G4P3003 female for annual exam. .   1. Postmenopausal. No significant hot flushes, night sweats, vaginal bleeding or any vaginal dryness. Continue to monitor report any issues or vaginal bleeding. 2. Mammography 08/2016. Continue with annual mammography when due. SBE monthly reviewed. 3. Colonoscopy 2016. Repeat at their recommended interval. 4. Pap smear/HPV 2014. Pap smear/HPV today. No history of significant abnormal Pap smears previously. 5. Health maintenance. Baseline CBC, CMP, hemoglobin A1c and urinalysis ordered. TSH, lipid profile, vitamin D and B-12 were all  normal last year. Follow up in one year, sooner as needed.   Anastasio Auerbach MD, 12:09 PM 09/03/2016

## 2016-09-04 LAB — URINALYSIS W MICROSCOPIC + REFLEX CULTURE
Bilirubin Urine: NEGATIVE
CASTS: NONE SEEN [LPF]
CRYSTALS: NONE SEEN [HPF]
GLUCOSE, UA: NEGATIVE
HGB URINE DIPSTICK: NEGATIVE
Ketones, ur: NEGATIVE
Nitrite: NEGATIVE
PROTEIN: NEGATIVE
Specific Gravity, Urine: 1.021 (ref 1.001–1.035)
YEAST: NONE SEEN [HPF]
pH: 6.5 (ref 5.0–8.0)

## 2016-09-04 LAB — HEMOGLOBIN A1C
HEMOGLOBIN A1C: 5 % (ref <5.7)
Mean Plasma Glucose: 97 mg/dL

## 2016-09-05 LAB — URINE CULTURE

## 2016-09-06 LAB — PAP IG AND HPV HIGH-RISK: HPV DNA High Risk: NOT DETECTED

## 2018-08-12 ENCOUNTER — Other Ambulatory Visit: Payer: Self-pay | Admitting: Gynecology

## 2018-08-12 DIAGNOSIS — Z1231 Encounter for screening mammogram for malignant neoplasm of breast: Secondary | ICD-10-CM

## 2018-08-18 ENCOUNTER — Ambulatory Visit: Payer: Self-pay

## 2018-08-21 ENCOUNTER — Other Ambulatory Visit: Payer: Self-pay

## 2018-08-21 ENCOUNTER — Ambulatory Visit
Admission: RE | Admit: 2018-08-21 | Discharge: 2018-08-21 | Disposition: A | Discharge status: Home / Self Care | Payer: Self-pay | Source: Ambulatory Visit | Attending: Gynecology | Admitting: Gynecology

## 2018-08-21 DIAGNOSIS — Z1231 Encounter for screening mammogram for malignant neoplasm of breast: Secondary | ICD-10-CM

## 2018-09-07 ENCOUNTER — Other Ambulatory Visit: Payer: Self-pay

## 2018-09-08 ENCOUNTER — Ambulatory Visit (INDEPENDENT_AMBULATORY_CARE_PROVIDER_SITE_OTHER): Payer: 59 | Admitting: Gynecology

## 2018-09-08 ENCOUNTER — Encounter: Payer: Self-pay | Admitting: Gynecology

## 2018-09-08 VITALS — BP 118/76 | Ht 66.0 in | Wt 143.0 lb

## 2018-09-08 DIAGNOSIS — Z1329 Encounter for screening for other suspected endocrine disorder: Secondary | ICD-10-CM

## 2018-09-08 DIAGNOSIS — Z01419 Encounter for gynecological examination (general) (routine) without abnormal findings: Secondary | ICD-10-CM

## 2018-09-08 DIAGNOSIS — Z13220 Encounter for screening for lipoid disorders: Secondary | ICD-10-CM

## 2018-09-08 DIAGNOSIS — Z1321 Encounter for screening for nutritional disorder: Secondary | ICD-10-CM | POA: Diagnosis not present

## 2018-09-08 NOTE — Patient Instructions (Signed)
Follow-up in 1 year for annual exam, sooner as needed. 

## 2018-09-08 NOTE — Progress Notes (Signed)
    Sharon Lopez 1961/02/15 [id number]        58 y.o.  [id number] for annual gynecologic exam.  No gynecologic complaints  Past medical history,surgical history, problem list, medications, allergies, family history and social history were all reviewed and documented as reviewed in the EPIC chart.  ROS:  Performed with pertinent positives and negatives included in the history, assessment and plan.   Additional significant findings : None   Exam: Caryn Bee assistant Vitals:   09/08/18 1108  BP: 118/76  Weight: 143 lb (64.9 kg)  Height: 5\' 6"  (1.676 m)   Body mass index is 23.08 kg/m.  General appearance:  Normal affect, orientation and appearance. Skin: Grossly normal HEENT: Without gross lesions.  No cervical or supraclavicular adenopathy. Thyroid normal.  Lungs:  Clear without wheezing, rales or rhonchi Cardiac: RR, without RMG Abdominal:  Soft, nontender, without masses, guarding, rebound, organomegaly or hernia Breasts:  Examined lying and sitting without masses, retractions, discharge or axillary adenopathy. Pelvic:  Ext, BUS, Vagina: With atrophic changes  Cervix: With atrophic changes  Uterus: Anteverted, normal size, shape and contour, midline and mobile nontender   Adnexa: Without masses or tenderness    Anus and perineum: Normal   Rectovaginal: Normal sphincter tone without palpated masses or tenderness.    Assessment/Plan:  58 y.o. G67P3003 female for annual gynecologic exam.   1. Postmenopausal.  No significant menopausal symptoms or any vaginal bleeding. 2. Mammography 08/2018.  Continue with annual mammography next year when due.  Breast exam normal today. 3. Colonoscopy 2016.  Repeat at their recommended interval. 4. Pap smear/HPV 2018.  No Pap smear done today.  No history of significant abnormal Pap smears.  Plan repeat Pap smear/HPV at 5-year interval per current screening guidelines. 5. DEXA never.  Will plan further into the menopause. 6. Health  maintenance.  Requests baseline labs.  CBC, CMP, lipid profile, TSH and vitamin D ordered.  Follow-up 1 year, sooner as needed.   Anastasio Auerbach MD, 12:14 PM 09/08/2018

## 2018-09-09 LAB — CBC WITH DIFFERENTIAL/PLATELET
Absolute Monocytes: 448 cells/uL (ref 200–950)
Basophils Absolute: 41 cells/uL (ref 0–200)
Basophils Relative: 0.7 %
Eosinophils Absolute: 148 cells/uL (ref 15–500)
Eosinophils Relative: 2.5 %
HCT: 40 % (ref 35.0–45.0)
Hemoglobin: 13.1 g/dL (ref 11.7–15.5)
Lymphs Abs: 1522 cells/uL (ref 850–3900)
MCH: 31.7 pg (ref 27.0–33.0)
MCHC: 32.8 g/dL (ref 32.0–36.0)
MCV: 96.9 fL (ref 80.0–100.0)
MPV: 10.6 fL (ref 7.5–12.5)
Monocytes Relative: 7.6 %
Neutro Abs: 3741 cells/uL (ref 1500–7800)
Neutrophils Relative %: 63.4 %
Platelets: 272 10*3/uL (ref 140–400)
RBC: 4.13 10*6/uL (ref 3.80–5.10)
RDW: 12.1 % (ref 11.0–15.0)
Total Lymphocyte: 25.8 %
WBC: 5.9 10*3/uL (ref 3.8–10.8)

## 2018-09-09 LAB — COMPREHENSIVE METABOLIC PANEL
AG Ratio: 2.3 (calc) (ref 1.0–2.5)
ALT: 12 U/L (ref 6–29)
AST: 15 U/L (ref 10–35)
Albumin: 4.8 g/dL (ref 3.6–5.1)
Alkaline phosphatase (APISO): 22 U/L — ABNORMAL LOW (ref 37–153)
BUN: 22 mg/dL (ref 7–25)
CO2: 27 mmol/L (ref 20–32)
Calcium: 9.9 mg/dL (ref 8.6–10.4)
Chloride: 104 mmol/L (ref 98–110)
Creat: 0.93 mg/dL (ref 0.50–1.05)
Globulin: 2.1 g/dL (calc) (ref 1.9–3.7)
Glucose, Bld: 101 mg/dL — ABNORMAL HIGH (ref 65–99)
Potassium: 4.8 mmol/L (ref 3.5–5.3)
Sodium: 139 mmol/L (ref 135–146)
Total Bilirubin: 0.7 mg/dL (ref 0.2–1.2)
Total Protein: 6.9 g/dL (ref 6.1–8.1)

## 2018-09-09 LAB — TSH: TSH: 2.37 mIU/L

## 2018-09-09 LAB — VITAMIN D 25 HYDROXY (VIT D DEFICIENCY, FRACTURES): Vit D, 25-Hydroxy: 45 ng/mL (ref 30–100)

## 2018-09-09 LAB — LIPID PANEL
Cholesterol: 193 mg/dL (ref <200)
HDL: 83 mg/dL (ref >=50)
LDL Cholesterol (Calc): 95 mg/dL (calc)
Non-HDL Cholesterol (Calc): 110 mg/dL (calc) (ref <130)
Total CHOL/HDL Ratio: 2.3 (calc) (ref <5.0)
Triglycerides: 68 mg/dL (ref <150)

## 2018-09-23 ENCOUNTER — Telehealth: Payer: Self-pay | Admitting: *Deleted

## 2018-09-23 NOTE — Telephone Encounter (Signed)
Patient called requesting lab results on 09/08/18. Patient informed.

## 2019-01-05 ENCOUNTER — Encounter: Payer: Self-pay | Admitting: Gynecology

## 2019-01-30 ENCOUNTER — Encounter (INDEPENDENT_AMBULATORY_CARE_PROVIDER_SITE_OTHER): Payer: Self-pay

## 2019-05-22 ENCOUNTER — Ambulatory Visit: Payer: 59 | Attending: Internal Medicine

## 2019-05-22 DIAGNOSIS — Z23 Encounter for immunization: Secondary | ICD-10-CM | POA: Insufficient documentation

## 2019-05-22 NOTE — Progress Notes (Signed)
   Covid-19 Vaccination Clinic  Name:  Sharon Lopez    MRN: [id number] DOB: 1960/10/12  05/22/2019  [name] was observed post Covid-19 immunization for 15 minutes without incidence. She was provided with Vaccine Information Sheet and instruction to access the V-Safe system.   [name] was instructed to call 911 with any severe reactions post vaccine: Marland Kitchen Difficulty breathing  . Swelling of your face and throat  . A fast heartbeat  . A bad rash all over your body  . Dizziness and weakness    Immunizations Administered    Name Date Dose VIS Date Route   Pfizer COVID-19 Vaccine 05/22/2019  3:21 PM 0.3 mL 03/19/2019 Intramuscular   Manufacturer: [place]   Lot: [id number]   [place]: [id number]

## 2019-06-26 ENCOUNTER — Ambulatory Visit: Payer: 59 | Attending: Internal Medicine

## 2019-06-26 DIAGNOSIS — Z23 Encounter for immunization: Secondary | ICD-10-CM

## 2019-06-26 NOTE — Progress Notes (Signed)
   Covid-19 Vaccination Clinic  Name:  Sharon Lopez    MRN: [id number] DOB: 01/17/1961  06/26/2019  Sharon Lopez was observed post Covid-19 immunization for 15 minutes without incident. She was provided with Vaccine Information Sheet and instruction to access the V-Safe system.   Sharon Lopez was instructed to call 911 with any severe reactions post vaccine: Marland Kitchen Difficulty breathing  . Swelling of face and throat  . A fast heartbeat  . A bad rash all over body  . Dizziness and weakness   Immunizations Administered    Name Date Dose VIS Date Route   Pfizer COVID-19 Vaccine 06/26/2019 10:48 AM 0.3 mL 03/19/2019 Intramuscular   Manufacturer: [place]   Lot: [id number]   [place]: [id number]

## 2020-08-18 ENCOUNTER — Other Ambulatory Visit: Payer: Self-pay | Admitting: Family Medicine

## 2020-08-18 DIAGNOSIS — Z1231 Encounter for screening mammogram for malignant neoplasm of breast: Secondary | ICD-10-CM

## 2020-08-31 ENCOUNTER — Other Ambulatory Visit: Payer: Self-pay

## 2020-08-31 ENCOUNTER — Ambulatory Visit
Admission: RE | Admit: 2020-08-31 | Discharge: 2020-08-31 | Disposition: A | Discharge status: Home / Self Care | Payer: 59 | Source: Ambulatory Visit | Attending: Family Medicine | Admitting: Family Medicine

## 2020-08-31 DIAGNOSIS — Z1231 Encounter for screening mammogram for malignant neoplasm of breast: Secondary | ICD-10-CM

## 2020-09-06 ENCOUNTER — Ambulatory Visit: Payer: 59 | Admitting: Nurse Practitioner

## 2020-09-08 NOTE — Progress Notes (Deleted)
GYNECOLOGY  VISIT  CC:   ***  HPI: 60 y.o. G41P3003 Married White or Caucasian female here for ***.     GYNECOLOGIC HISTORY: Patient's last menstrual period was 06/06/2013. Contraception: *** Menopausal hormone therapy: ***  There are no problems to display for this patient.   Past Medical History:  Diagnosis Date  . DVT (deep venous thrombosis) ([place]) 2009   WAS ON BIRTH CONTROL PILLS    Past Surgical History:  Procedure Laterality Date  . DILATATION & CURETTAGE/HYSTEROSCOPY WITH TRUECLEAR N/A 11/26/2012   Procedure: DILATATION & CURETTAGE/HYSTEROSCOPY WITH TRUECLEAR;  Surgeon: Anastasio Auerbach, MD;  Location: [facility];  Service: Gynecology;  Laterality: N/A;  [facility] WILL INSERVICE NURSES    MEDS:   Current Outpatient Medications on File Prior to Visit  Medication Sig Dispense Refill  . ibuprofen (ADVIL,MOTRIN) 200 MG tablet Take 600 mg by mouth daily as needed for headache.     No current facility-administered medications on file prior to visit.    ALLERGIES: Amoxicillin  Family History  Problem Relation Age of Onset  . Heart failure Mother   . Cancer Mother        Lung  . Heart disease Maternal Grandmother   . Breast cancer Maternal Grandmother 80  . Stroke Paternal Grandfather   . Colon cancer Neg Hx   . Esophageal cancer Neg Hx   . Rectal cancer Neg Hx   . Stomach cancer Neg Hx      Review of Systems  PHYSICAL EXAMINATION:    LMP 06/06/2013     General appearance: alert, cooperative, no acute distress  CV:  {Exam; heart brief:31539} Lungs:  {pe lungs ob:314451::"clear to auscultation, no wheezes, rales or rhonchi, symmetric air entry"} Breasts: {Exam; breast:13139::"normal appearance, no masses or tenderness"} Abdomen: soft, non-tender; no masses,  no organomegaly Lymph:  no inguinal LAD noted  Pelvic: External genitalia:  no lesions              Urethra:  normal appearing urethra with no masses, tenderness or lesions              Bartholins and  Skenes: normal                 Vagina: normal appearing vagina               Cervix: {CHL AMB PHY EX CERVIX NORM DEFAULT:[phone]::"no lesions"}              Bimanual Exam:  Uterus:  {CHL AMB PHY EX UTERUS NORM DEFAULT:[phone]::"normal size, contour, position, consistency, mobility, non-tender"}              Adnexa: {CHL AMB PHY EX ADNEXA NO MASS [phone]::"no mass, fullness, tenderness"}               Chaperone, ***, CMA, was present for exam.  Assessment: ***  Plan: ***   {NUMBERS; -10-45 JOINT ROM:10287} minutes of total time was spent for this patient encounter, including preparation, face-to-face counseling with the patient and coordination of care, and documentation of the encounter.

## 2020-09-11 ENCOUNTER — Ambulatory Visit: Payer: 59 | Admitting: Nurse Practitioner

## 2020-09-11 NOTE — Progress Notes (Signed)
GYNECOLOGY  VISIT  CC:   Brown spotting x 2 days 3 weeks ago  HPI: 60 y.o. G68P3003 Married White or Caucasian female here for post menopausal bleeding. At that time, felt mild cramps, but now she is fine. Has been post menopausal x 6 years.  Spotting occurred while playing tennis. Does not report any vaginal irritation or discharge. Can not remember if spotting occurred the day after sex, is sexually active about 3 times per week.   GYNECOLOGIC HISTORY: Patient's last menstrual period was 06/06/2013. Contraception: post menopausal Menopausal hormone therapy: none  There are no problems to display for this patient.   Past Medical History:  Diagnosis Date  . DVT (deep venous thrombosis) ([place]) 2009   WAS ON BIRTH CONTROL PILLS    Past Surgical History:  Procedure Laterality Date  . DILATATION & CURETTAGE/HYSTEROSCOPY WITH TRUECLEAR N/A 11/26/2012   Procedure: DILATATION & CURETTAGE/HYSTEROSCOPY WITH TRUECLEAR;  Surgeon: Anastasio Auerbach, MD;  Location: [facility];  Service: Gynecology;  Laterality: N/A;  [facility] WILL INSERVICE NURSES  . ROOT CANAL      MEDS:   Current Outpatient Medications on File Prior to Visit  Medication Sig Dispense Refill  . clindamycin (CLEOCIN) 150 MG capsule Take 150 mg by mouth 4 (four) times daily.    Marland Kitchen ibuprofen (ADVIL,MOTRIN) 200 MG tablet Take 600 mg by mouth daily as needed for headache.    . Ibuprofen-diphenhydrAMINE Cit (ADVIL PM PO) Take by mouth.    . MELATONIN PO Take by mouth.     No current facility-administered medications on file prior to visit.    ALLERGIES: Amoxicillin  Family History  Problem Relation Age of Onset  . Heart failure Mother   . Cancer Mother        Lung  . Other Mother        per patient, precancerous uterus cell?  . Prostate cancer Father   . Heart disease Maternal Grandmother   . Breast cancer Maternal Grandmother 80  . Stroke Paternal Grandfather      Review of Systems  Constitutional: Negative.   HENT:  Negative.   Eyes: Negative.   Respiratory: Negative.   Cardiovascular: Negative.   Gastrointestinal: Negative.   Endocrine: Negative.   Genitourinary:       Brownish spotting  Musculoskeletal: Negative.   Skin: Negative.   Allergic/Immunologic: Negative.   Neurological: Negative.   Hematological: Negative.   Psychiatric/Behavioral: Negative.     PHYSICAL EXAMINATION:    BP 114/72   Pulse 78   Resp 16   Wt 136 lb (61.7 kg)   LMP 06/06/2013   BMI 21.95 kg/m     General appearance: alert, cooperative, no acute distress  Abdomen: soft, non-tender; no masses,  no organomegaly Lymph:  no inguinal LAD noted  Pelvic: External genitalia:  no lesions, mild labial edema, no erythema              Urethra:  normal appearing urethra with no masses, tenderness or lesions              Bartholins and Skenes: normal                 Vagina: normal appearing vagina with atrophy, no evidence of bloody discharge              Cervix: no cervical motion tenderness and no lesions, no visible polyp              Bimanual Exam:  Uterus:  normal size,  contour, position, consistency, mobility, non-tender and anteverted              Adnexa: no mass, fullness, tenderness               Wet mount WNL  Chaperone, Joy, CMA, was present for exam.  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

## 2020-09-12 ENCOUNTER — Ambulatory Visit (INDEPENDENT_AMBULATORY_CARE_PROVIDER_SITE_OTHER): Payer: 59 | Admitting: Nurse Practitioner

## 2020-09-12 ENCOUNTER — Encounter: Payer: Self-pay | Admitting: Nurse Practitioner

## 2020-09-12 ENCOUNTER — Other Ambulatory Visit: Payer: Self-pay

## 2020-09-12 VITALS — BP 114/72 | HR 78 | Resp 16 | Wt 136.0 lb

## 2020-09-12 DIAGNOSIS — N950 Postmenopausal bleeding: Secondary | ICD-10-CM | POA: Diagnosis not present

## 2020-09-12 LAB — WET PREP FOR TRICH, YEAST, CLUE

## 2020-09-12 NOTE — Patient Instructions (Signed)
Postmenopausal Bleeding Postmenopausal bleeding is any bleeding that a woman has after she has entered menopause. Menopause is the end of a woman's fertile years. After menopause, a woman no longer ovulates and does not have menstrual periods. Therefore, she should no longer have bleeding from her vagina. Postmenopausal bleeding may have various causes, including:  Menopausal hormone therapy (MHT).  Endometrial atrophy. After menopause, low estrogen hormone levels cause the membrane that lines the uterus (endometrium) to become thin. You may have bleeding as the endometrium thins.  Endometrial hyperplasia. This condition is caused by excess estrogen hormones and low levels of progesterone hormones. The excess estrogen causes the endometrium to thicken, which can lead to bleeding. In some cases, this can lead to cancer of the uterus.  Endometrial cancer.  Noncancerous growths (polyps) on the endometrium, the lining of the uterus, or the cervix.  Uterine fibroids. These are noncancerous growths in or around the uterus muscle tissue that can cause heavy bleeding. Any type of postmenopausal bleeding, even if it appears to be a typical menstrual period, should be checked by your health care provider. Treatment will depend on the cause of the bleeding. Follow these instructions at home:  Pay attention to any changes in your symptoms. Let your health care provider know about them.  Avoid using tampons and douches as told by your health care provider.  Change your pads regularly.  Get regular pelvic exams, including Pap tests, as told by your health care provider.  Take iron supplements as told by your health care provider.  Take over-the-counter and prescription medicines only as told by your health care provider.  Keep all follow-up visits. This is important.   Contact a health care provider if:  You have new bleeding from the vagina after menopause.  You have pain in your abdomen. Get  help right away if:  You have a fever or chills.  You have severe pain with bleeding.  You are passing blood clots.  You have heavy bleeding, need more than 1 pad an hour, and have never experienced this before.  You have headaches or feel faint or dizzy. Summary  Postmenopausal bleeding is any bleeding that a woman has after she has entered into menopause.  Postmenopausal bleeding may have various causes. Treatment will depend on the cause of the bleeding.  Any type of postmenopausal bleeding, even if it appears to be a typical menstrual period, should be checked by your health care provider.  Be sure to pay attention to any changes in your symptoms and keep all follow-up visits. This information is not intended to replace advice given to you by your health care provider. Make sure you discuss any questions you have with your health care provider. Document Revised: 09/09/2019 Document Reviewed: 09/09/2019 Elsevier Patient Education  2021 Elsevier Inc.  

## 2020-09-25 ENCOUNTER — Ambulatory Visit: Payer: 59 | Admitting: Obstetrics & Gynecology

## 2020-10-02 ENCOUNTER — Inpatient Hospital Stay
Admit: 2020-10-02 | Payer: PRIVATE HEALTH INSURANCE | Attending: Student in an Organized Health Care Education/Training Program

## 2020-10-02 DIAGNOSIS — K029 Dental caries, unspecified: Secondary | ICD-10-CM

## 2020-10-02 MED ORDER — chlorhexidine (PERIDEX) 0.12 % solution
0.12 | Freq: Every evening | 0 refills | 16.00000 days | Status: AC
Start: 2020-10-02 — End: 2020-10-10

## 2020-10-02 MED ORDER — AMOXicillin-clavulanate (AUGMENTIN) 875-125 mg per tablet
875-125 | ORAL_TABLET | Freq: Two times a day (BID) | ORAL | 0 refills | Status: AC
Start: 2020-10-02 — End: 2020-10-07

## 2020-10-02 MED ORDER — oxyCODONE (ROXICODONE) 5 MG immediate release tablet
5 | ORAL_TABLET | Freq: Four times a day (QID) | ORAL | 0 refills | 6.00000 days | Status: AC | PRN
Start: 2020-10-02 — End: 2020-10-04

## 2020-10-02 MED ORDER — ibuprofen (MOTRIN) 800 MG tablet
800 | ORAL_TABLET | Freq: Three times a day (TID) | ORAL | 0 refills | Status: AC | PRN
Start: 2020-10-02 — End: ?

## 2020-10-02 MED ORDER — acetaminophen (TYLENOL) 325 MG tablet
325 | ORAL_TABLET | Freq: Four times a day (QID) | ORAL | 0 refills | 11.00000 days | Status: AC | PRN
Start: 2020-10-02 — End: 2020-10-12

## 2020-10-02 NOTE — Unmapped (Signed)
[place]   Oral Maxillofacial Surgery    Pt. Name: Bonnie Fischer  Pt. MRN: [id number]  DOB: 10-Oct-1960            Sex: female  Provider: Lamount Cohen, DDS  Resident:Ansar Skoda, DMD  Location of Care: [facility]    Procedure: Extraction #3 and 14 with socket preservation under LA  Date of Procedure: 10/02/2020     Reviewed H&P; no significant changes to medical history or medications.  History and heart/lung exam unchanged.    Informed Consent/Counseling Statement:  Plan, alternatives and risks of anesthesia, including death have been explained to and discussed with the patient/legal guardian.  By my assessment, the patient/legal guardian understands and agrees. Scenario presented in detail. Question answered.    Local anesthesia administered: 2 carpules of Lidocaine 2% with 1:100k epi                                                      Teeth #3 and 14:  Anesthesia administered via block and local infiltration. After anesthesia was achieved, gauze throat pack placed and bite block inserted.    A periosteal elevator was used to sever the periodontal fibers.  Tooth #3 was elevated and removed from its socket with dental elevators and forceps without complications. The socket was curetted free of granulation tissue, suctioned and irrigated. Socket was packed with allogenic bone graft and membrane was used to close over grafted site. 3-0 chromic simple interrupted and running sutures used to close site.     Tooth #14 was sectioned using a surgical handpiece with copious irrigation. Roots were separated using a straight elevator and removed from the socket atraumatically. Socket curetted free of granulation tissue and irrigated thoroughly. Collaplug was placed into the socket and closed site with running 3-0 chromic gut suture.     Gauze pack placed, hemostasis achieved.  Complications/Abnormal findings: N/A  Estimated Blood Loss: Minimal  Patient tolerated anesthesia and procedure well.    Puros  Cortico-Cancellous Particulate Allograft  REF: [id number]  LOT: [id number]  Qty: 0.5 cc    RCM6 Collagen membrane  15 mm x 20 mm  REF: [id number]  LOT: [id number]    Rx: Ibuprofen 600 x 30 tabs  Peridex Mouthrinse   Augmentin 875/125 BID x 5 days  Tylenol 650 mg q6h   Oxycodone 5 mg q4-6h disp 5 tabs 0 refill      Postoperative instructions given both verbally and written. Extra gauze packs given to patient.  Disposition: Patient to follow up on an as needed basis    Signed by: Cornelius Moras  Date: 10/02/20   Time: 11:52 AM

## 2020-10-02 NOTE — Unmapped (Signed)
[place] [facility]      Visit Type:  LOCAL ANESTHESIA  Pt. Name: Bonnie Fischer  Pt. MRN: [id number]  DOB: [birth year]              Sex: female  Visit Date:  10/02/2020  Provider: Lamount Cohen, DDS  Resident: Cornelius Moras, DMD  Location of Care: [facility]         No chief complaint on file.      HPI  Bonnie Fischer is a/an 60 y.o. female referred from general dentist for extraction of maxillary first molars due to root canal treatment failure. Pt denies swelling, drainage, trismus, NVFC, dyspnea, dysphagia, dysphonia, shortness of breath, chest pain or any other symptoms.    Past Med/Surg/Family/Social History:  Allergies: No Known Allergies  Medical History: No past medical history on file.  Medications:   Current Outpatient Medications   Medication Sig   ??? acetaminophen Take 2 tablets (650 mg total) by mouth every 6 hours as needed for up to 10 days.   ??? AMOXicillin-clavulanate Take 1 tablet by mouth 2 times a day for 5 days.   ??? chlorhexidine Use as directed 15 mLs in the mouth or throat at bedtime for 8 days. Do not rinse vigorously for the first 2 days after surgery. Limit use to 10 days.   ??? ibuprofen Take 1 tablet (800 mg total) by mouth every 8 hours as needed for Pain.   ??? oxyCODONE Take 1 tablet (5 mg total) by mouth every 6 hours as needed for Pain for up to 2 days.     No current facility-administered medications for this encounter.     No past surgical history on file.  No family history on file.  Social History     Occupational History   ??? Not on file   Tobacco Use   ??? Smoking status: Not on file   ??? Smokeless tobacco: Not on file   Substance and Sexual Activity   ??? Alcohol use: Not on file   ??? Drug use: Not on file   ??? Sexual activity: Not on file       ROS:     There were no vitals filed for this visit.  There is no height or weight on file to calculate BMI.    Review of Systems: 10-point ROS completed and is negative except noted in HPI.         Objective:      Maxillofacial:   ?? Atraumatic  ?? Normo-cephalic  ?? No facial swelling  ?? No cervical masses or LAD  ?? No pain to digital palpation - bilaterally  ?? No clicking/popping/crepitus of TMJ  ?? Normal anterior and laterotrusive movements  ?? No trismus  ?? CN II-XII intact    Oral:   ?? Normal salivary flow, mucosa moist and pink  ?? No vestibular edema/swelling/erythema  ?? No uvular deviation, FOM soft and non-tender  ?? No signs of acute infection  ?? No purulence or drainage or fistulae noted  ?? No soft tissue pathology      Airway  Thyromental distance: > 6 cm  Maximal incisal opening: > 40 mm  Tongue Size: Normal  Mallampati Classification: I    Neck: no significant adenopathy, no scars, thyroid normal size  Chest/Respiratory: no gross deformities, no apparent respiratory distress, clear to auscultation, no wheezes, rales, ronchi  Cardiovascular: normal rate and rhythm; no murmurs, rubs, gallops  Neuro: cranial nerves grossly intact, sensation grossly  intact, non-focal, station & gait normal, appropriate mental status  Psych: affect and mood appropriate, normal interaction    Radiographic Evaluation/Imaging  Maxillary sinuses are equal in size and radiodensity. Mandibular condyles are well-formed and seated in the glenoid fossa.  No other radiographic evidence of maxillary or mandibular pathology. RTC #3 and 14.           Assessment/Plan:     ASA Classification: 1    Bonnie Fischer is a/an 60 y.o. female with hopeless teeth #3 and 14 who elects for extraction under LA.    1. Add-on for extraction #3 and 14 under LA.  2. Informed consent will be obtained on day of surgery.  3. Risks, benefits, complications and treatment options discussed with patient.         Cornelius Moras, DMD  10/02/2020 11:50 AM  [facility]  [facility] ORAL AND MAXILLOFACIAL SURGERY AT [facility]  [address]  [place] [address]  Dept: [phone]

## 2020-10-04 ENCOUNTER — Other Ambulatory Visit (HOSPITAL_COMMUNITY)
Admission: RE | Admit: 2020-10-04 | Discharge: 2020-10-04 | Disposition: A | Discharge status: Home / Self Care | Payer: 59 | Source: Ambulatory Visit | Attending: Obstetrics & Gynecology | Admitting: Obstetrics & Gynecology

## 2020-10-04 ENCOUNTER — Other Ambulatory Visit: Payer: Self-pay

## 2020-10-04 ENCOUNTER — Encounter: Payer: Self-pay | Admitting: Obstetrics & Gynecology

## 2020-10-04 ENCOUNTER — Ambulatory Visit (INDEPENDENT_AMBULATORY_CARE_PROVIDER_SITE_OTHER): Payer: 59 | Admitting: Obstetrics & Gynecology

## 2020-10-04 VITALS — BP 122/76 | Ht 65.5 in | Wt 136.0 lb

## 2020-10-04 DIAGNOSIS — N950 Postmenopausal bleeding: Secondary | ICD-10-CM | POA: Diagnosis not present

## 2020-10-04 DIAGNOSIS — Z780 Asymptomatic menopausal state: Secondary | ICD-10-CM

## 2020-10-04 DIAGNOSIS — Z01419 Encounter for gynecological examination (general) (routine) without abnormal findings: Secondary | ICD-10-CM

## 2020-10-04 NOTE — Progress Notes (Signed)
Sharon Lopez 1960-09-20 [id number]   History:    60 y.o. G3P3L3   RP:  Established patient presenting for annual gyn exam   HPI: Postmenopausal, well on no HRT.  Light PMB in 09/2020, Pelvic US scheduled for 10/25/2020.  No current vaginal bleeding.  No pelvic pain.  No fam h/o Ovarian Ca, but patient had friends with it and was inquiring about prophylactic BSO.  Breasts normal.  Screening Mammography Negative 09/01/2018. BMI 22.29.  Colonoscopy 2016.  Pap smear/HPV 2018.  F/U Fasting Health Labs.     Past medical history,surgical history, family history and social history were all reviewed and documented in the EPIC chart.  Gynecologic History Patient's last menstrual period was 06/06/2013.  Obstetric History OB History  Gravida Para Term Preterm AB Living  '3 3 3     3  ' SAB IAB Ectopic Multiple Live Births               # Outcome Date GA Lbr Len/2nd Weight Sex Delivery Anes PTL Lv  3 Term           2 Term           1 Term              ROS: A ROS was performed and pertinent positives and negatives are included in the history.  GENERAL: No fevers or chills. HEENT: No change in vision, no earache, sore throat or sinus congestion. NECK: No pain or stiffness. CARDIOVASCULAR: No chest pain or pressure. No palpitations. PULMONARY: No shortness of breath, cough or wheeze. GASTROINTESTINAL: No abdominal pain, nausea, vomiting or diarrhea, melena or bright red blood per rectum. GENITOURINARY: No urinary frequency, urgency, hesitancy or dysuria. MUSCULOSKELETAL: No joint or muscle pain, no back pain, no recent trauma. DERMATOLOGIC: No rash, no itching, no lesions. ENDOCRINE: No polyuria, polydipsia, no heat or cold intolerance. No recent change in weight. HEMATOLOGICAL: No anemia or easy bruising or bleeding. NEUROLOGIC: No headache, seizures, numbness, tingling or weakness. PSYCHIATRIC: No depression, no loss of interest in normal activity or change in sleep pattern.     Exam:   BP  122/76   Ht 5' 5.5" (1.664 m)   Wt 136 lb (61.7 kg)   LMP 06/06/2013   BMI 22.29 kg/m   Body mass index is 22.29 kg/m.  General appearance : Well developed well nourished female. No acute distress HEENT: Eyes: no retinal hemorrhage or exudates,  Neck supple, trachea midline, no carotid bruits, no thyroidmegaly Lungs: Clear to auscultation, no rhonchi or wheezes, or rib retractions  Heart: Regular rate and rhythm, no murmurs or gallops Breast:Examined in sitting and supine position were symmetrical in appearance, no palpable masses or tenderness,  no skin retraction, no nipple inversion, no nipple discharge, no skin discoloration, no axillary or supraclavicular lymphadenopathy Abdomen: no palpable masses or tenderness, no rebound or guarding Extremities: no edema or skin discoloration or tenderness  Pelvic: Vulva: Normal             Vagina: No gross lesions or discharge  Cervix: No gross lesions or discharge.  Pap reflex done.  Uterus  AV, normal size, shape and consistency, non-tender and mobile  Adnexa  Without masses or tenderness  Anus: Normal   Assessment/Plan:  60 y.o. female for annual exam   1. Encounter for routine gynecological examination with Papanicolaou smear of cervix Normal gynecologic exam in menopause.  Pap reflex done.  Breast exam normal.  Screening mammogram was negative May 26th,  2022.  Colonoscopy in 2016.  Good body mass index at 22.29.  Continue with fitness and healthy nutrition.  Follow-up here for fasting health labs. - Cytology - PAP( [place]) - CBC; Future - Comp Met (CMET); Future - TSH; Future - Lipid Profile; Future - Vitamin D 1,25 dihydroxy; Future  2. Postmenopause Well on no hormone replacement therapy.  Vitamin D supplements, calcium intake of 1.5 g/day total and regular weightbearing physical activities.  3. Post-menopausal bleeding  Light postmenopausal bleeding early June 2022.  No current bleeding.  We will follow-up for pelvic  ultrasound. - Pelvic US  [name] MD, 11:15 AM 10/04/2020

## 2020-10-05 ENCOUNTER — Other Ambulatory Visit: Payer: 59

## 2020-10-05 ENCOUNTER — Other Ambulatory Visit: Payer: 59 | Admitting: Obstetrics & Gynecology

## 2020-10-06 ENCOUNTER — Encounter: Payer: Self-pay | Admitting: *Deleted

## 2020-10-06 LAB — CYTOLOGY - PAP: Diagnosis: NEGATIVE

## 2020-10-16 ENCOUNTER — Ambulatory Visit: Payer: 59

## 2020-10-17 ENCOUNTER — Other Ambulatory Visit: Payer: 59

## 2020-10-17 ENCOUNTER — Other Ambulatory Visit: Payer: Self-pay

## 2020-10-17 DIAGNOSIS — Z01419 Encounter for gynecological examination (general) (routine) without abnormal findings: Secondary | ICD-10-CM

## 2020-10-20 LAB — CBC
HCT: 39.9 % (ref 35.0–45.0)
Hemoglobin: 13.2 g/dL (ref 11.7–15.5)
MCH: 31.7 pg (ref 27.0–33.0)
MCHC: 33.1 g/dL (ref 32.0–36.0)
MCV: 95.9 fL (ref 80.0–100.0)
MPV: 9.9 fL (ref 7.5–12.5)
Platelets: 299 10*3/uL (ref 140–400)
RBC: 4.16 10*6/uL (ref 3.80–5.10)
RDW: 11.9 % (ref 11.0–15.0)
WBC: 6.1 10*3/uL (ref 3.8–10.8)

## 2020-10-20 LAB — LIPID PANEL
Cholesterol: 187 mg/dL (ref <200)
HDL: 89 mg/dL (ref >=50)
LDL Cholesterol (Calc): 84 mg/dL (calc)
Non-HDL Cholesterol (Calc): 98 mg/dL (calc) (ref <130)
Total CHOL/HDL Ratio: 2.1 (calc) (ref <5.0)
Triglycerides: 68 mg/dL (ref <150)

## 2020-10-20 LAB — COMPREHENSIVE METABOLIC PANEL
AG Ratio: 2.1 (calc) (ref 1.0–2.5)
ALT: 13 U/L (ref 6–29)
AST: 13 U/L (ref 10–35)
Albumin: 4.6 g/dL (ref 3.6–5.1)
Alkaline phosphatase (APISO): 24 U/L — ABNORMAL LOW (ref 37–153)
BUN: 17 mg/dL (ref 7–25)
CO2: 27 mmol/L (ref 20–32)
Calcium: 9.6 mg/dL (ref 8.6–10.4)
Chloride: 106 mmol/L (ref 98–110)
Creat: 1 mg/dL (ref 0.50–1.03)
Globulin: 2.2 g/dL (calc) (ref 1.9–3.7)
Glucose, Bld: 96 mg/dL (ref 65–99)
Potassium: 4.3 mmol/L (ref 3.5–5.3)
Sodium: 142 mmol/L (ref 135–146)
Total Bilirubin: 0.7 mg/dL (ref 0.2–1.2)
Total Protein: 6.8 g/dL (ref 6.1–8.1)

## 2020-10-20 LAB — VITAMIN D 1,25 DIHYDROXY
Vitamin D 1, 25 (OH)2 Total: 47 pg/mL (ref 18–72)
Vitamin D2 1, 25 (OH)2: <8 pg/mL
Vitamin D3 1, 25 (OH)2: 47 pg/mL

## 2020-10-20 LAB — TSH: TSH: 3.98 mIU/L (ref 0.40–4.50)

## 2020-10-25 ENCOUNTER — Ambulatory Visit (INDEPENDENT_AMBULATORY_CARE_PROVIDER_SITE_OTHER): Payer: 59 | Admitting: Obstetrics & Gynecology

## 2020-10-25 ENCOUNTER — Ambulatory Visit (INDEPENDENT_AMBULATORY_CARE_PROVIDER_SITE_OTHER): Payer: 59

## 2020-10-25 ENCOUNTER — Encounter: Payer: Self-pay | Admitting: Obstetrics & Gynecology

## 2020-10-25 ENCOUNTER — Other Ambulatory Visit: Payer: Self-pay

## 2020-10-25 VITALS — BP 102/62 | HR 69 | Resp 16

## 2020-10-25 DIAGNOSIS — N950 Postmenopausal bleeding: Secondary | ICD-10-CM

## 2020-10-25 NOTE — Progress Notes (Signed)
    Sharon Lopez 1960-06-08 [id number]        60 y.o.  G3P3003   RP: PMB for Pelvic US  HPI: Postmenopausal, well on no HRT.  Light PMB in early 09/2020.  No current vaginal bleeding.  No pelvic pain.   OB History  Gravida Para Term Preterm AB Living  3 3 3     3   SAB IAB Ectopic Multiple Live Births               # Outcome Date GA Lbr Len/2nd Weight Sex Delivery Anes PTL Lv  3 Term           2 Term           1 Term             Past medical history,surgical history, problem list, medications, allergies, family history and social history were all reviewed and documented in the EPIC chart.   Directed ROS with pertinent positives and negatives documented in the history of present illness/assessment and plan.  Exam:  Vitals:   10/25/20 1324  BP: 102/62  Pulse: 69  Resp: 16   General appearance:  Normal  Pelvic US today: T/V images.  Anteverted uterus normal in size and shape with no myometrial mass.  The uterus is measured at 3.54 x 2.48 cm.  The endometrial lining is thin and symmetrical with no mass or thickening seen, measured at 2.19 mm.  Trace fluid is present in the uterine cavity.  Both ovaries are normal in size and shape with no mass seen.  No adnexal mass.  No free fluid in the pelvis.   Assessment/Plan:  60 y.o. G3P3003   1. Post-menopausal bleeding Very light resolved postmenopausal bleeding in early June 2022.  Pelvic ultrasound completely normal with findings thoroughly reviewed with patient.  Patient reassured.  Other orders - Ascorbic Acid (VITAMIN C PO); Take by mouth.   [name] MD, 1:58 PM 10/25/2020

## 2021-03-15 ENCOUNTER — Ambulatory Visit (INDEPENDENT_AMBULATORY_CARE_PROVIDER_SITE_OTHER): Payer: Self-pay | Admitting: Plastic Surgery

## 2021-03-15 ENCOUNTER — Other Ambulatory Visit: Payer: Self-pay

## 2021-03-15 DIAGNOSIS — Z411 Encounter for cosmetic surgery: Secondary | ICD-10-CM

## 2021-03-15 NOTE — Progress Notes (Signed)
Patient presents to discuss Botox treatment.  She is bothered by static and dynamic lines primarily in the forehead and glabella.  She has had Botox in the past but cannot recall exactly how many units.  She has been overall happy with her treatments in the past but in particular has not liked a Spock eyebrow that she occasionally gets.  We reviewed the risks and benefits of Botox treatment.  30 units of Botox were then used and injected in the forehead and glabella in a standard injection pattern after prepping and alcohol pad.  She tolerated this fine.  We will plan to see her at her next visit.  All of her questions were answered.

## 2021-06-05 ENCOUNTER — Other Ambulatory Visit: Payer: Self-pay

## 2021-06-05 ENCOUNTER — Encounter: Payer: Self-pay | Admitting: Obstetrics & Gynecology

## 2021-06-05 ENCOUNTER — Ambulatory Visit (INDEPENDENT_AMBULATORY_CARE_PROVIDER_SITE_OTHER): Payer: 59 | Admitting: Obstetrics & Gynecology

## 2021-06-05 ENCOUNTER — Telehealth: Payer: Self-pay | Admitting: *Deleted

## 2021-06-05 VITALS — BP 94/64 | HR 70

## 2021-06-05 DIAGNOSIS — M7989 Other specified soft tissue disorders: Secondary | ICD-10-CM

## 2021-06-05 DIAGNOSIS — D171 Benign lipomatous neoplasm of skin and subcutaneous tissue of trunk: Secondary | ICD-10-CM

## 2021-06-05 NOTE — Telephone Encounter (Signed)
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  Counseling and management.

## 2021-06-05 NOTE — Telephone Encounter (Signed)
Staff message sent to referral coordinator to call and schedule and let me know date/time.

## 2021-06-05 NOTE — Progress Notes (Signed)
° ° °  Sharon Lopez July 26, 1960 [id number]        61 y.o.  G3P3003   RP: Rt axillary line chest lump x 2 weeks  HPI: Patient noticed a soft lump under her skin at the Rt axillary line of her chest.  She had not noticed the lump before.  It is not painful or tender and it remained stable in sized.  Breasts and axillae normal.  Mammo Neg in 08/2020.  Patient in thin and fit.   OB History  Gravida Para Term Preterm AB Living  3 3 3     3   SAB IAB Ectopic Multiple Live Births               # Outcome Date GA Lbr Len/2nd Weight Sex Delivery Anes PTL Lv  3 Term           2 Term           1 Term             Past medical history,surgical history, problem list, medications, allergies, family history and social history were all reviewed and documented in the EPIC chart.   Directed ROS with pertinent positives and negatives documented in the history of present illness/assessment and plan.  Exam:  Vitals:   06/05/21 1349  BP: 94/64  Pulse: 70   General appearance:  Normal  Breasts normal bilaterally.  Axillae normal.  Soft under the skin mass about 3 x 4 cm.  Non tender.  C/W a Lipoma.   Assessment/Plan:  61 y.o. G3P3003   1. Soft tissue mass Patient noticed a soft lump under her skin at the Rt axillary line of her chest.  She had not noticed the lump before.  It is not painful or tender and it remained stable in sized.  Breasts and axillae normal.  Mammo Neg in 08/2020.  Patient in thin and fit. Soft under the skin mass about 3 x 4 cm.  Non tender.  C/W a Lipoma.  2. Lipoma of torso Refer to general surgeon for counseling and management.  Other orders - valACYclovir (VALTREX) 1000 MG tablet; Take by mouth as needed.   [name] MD, 1:56 PM 06/05/2021

## 2021-06-14 NOTE — Telephone Encounter (Signed)
Patient scheduled wtih Dr Redmond Pulling on 07/04/21   ?

## 2021-06-14 NOTE — Telephone Encounter (Signed)
[facility] has left message for patient to call.  ?

## 2021-08-28 ENCOUNTER — Telehealth: Payer: Self-pay | Admitting: *Deleted

## 2021-08-28 ENCOUNTER — Other Ambulatory Visit: Payer: Self-pay | Admitting: Obstetrics & Gynecology

## 2021-08-28 DIAGNOSIS — M7989 Other specified soft tissue disorders: Secondary | ICD-10-CM

## 2021-08-28 DIAGNOSIS — D171 Benign lipomatous neoplasm of skin and subcutaneous tissue of trunk: Secondary | ICD-10-CM

## 2021-08-28 NOTE — Telephone Encounter (Signed)
Patient left voicemail on triage line asking for diagnostic MMG order. RN called patient. Patient states she was referred to Dr. Redmond Pulling for lipoma, but that he did not have any availability for surgery until August and she did not want to wait that long for surgery. Patient states she is now being seen by Dr. Audry Pili at [facility] in [place]. Patient states that Dr. Audry Pili recommended diagnostic breast imaging prior to surgery since the lipoma was so close to her axillary area. Patient is due for screening MMG on 08-31-21. Asking if Dr. Dellis Filbert can order diagnostic imaging to be done at [facility]? RN advised would send to Dr. Dellis Filbert for review. Patient agreeable.   Routing to provider for review.   Order pended for bilateral diagnostic breast imaging for MD review and approval.

## 2021-09-04 ENCOUNTER — Other Ambulatory Visit: Payer: Self-pay | Admitting: Obstetrics & Gynecology

## 2021-09-04 DIAGNOSIS — R2231 Localized swelling, mass and lump, right upper limb: Secondary | ICD-10-CM

## 2021-09-04 NOTE — Telephone Encounter (Signed)
Patient informed with appointment on 09/06/21 at the breast center.

## 2021-09-04 NOTE — Telephone Encounter (Signed)
Diagnostic MMG ordered per Dr. Dellis Filbert.

## 2021-09-06 ENCOUNTER — Ambulatory Visit
Admission: RE | Admit: 2021-09-06 | Discharge: 2021-09-06 | Disposition: A | Discharge status: Home / Self Care | Payer: 59 | Source: Ambulatory Visit | Attending: Obstetrics & Gynecology | Admitting: Obstetrics & Gynecology

## 2021-09-06 DIAGNOSIS — D171 Benign lipomatous neoplasm of skin and subcutaneous tissue of trunk: Secondary | ICD-10-CM

## 2021-09-06 DIAGNOSIS — R2231 Localized swelling, mass and lump, right upper limb: Secondary | ICD-10-CM

## 2021-11-05 DIAGNOSIS — M19079 Primary osteoarthritis, unspecified ankle and foot: Secondary | ICD-10-CM | POA: Insufficient documentation

## 2021-11-05 DIAGNOSIS — M21619 Bunion of unspecified foot: Secondary | ICD-10-CM | POA: Insufficient documentation

## 2021-11-05 DIAGNOSIS — M2022 Hallux rigidus, left foot: Secondary | ICD-10-CM | POA: Insufficient documentation

## 2022-08-08 IMAGING — MG MM DIGITAL SCREENING BILAT W/ TOMO AND CAD
8 series · 9 of 24 positions shown · non-contrast
Comparison: Previous exam(s).

CLINICAL DATA: Screening.

EXAM:
DIGITAL SCREENING BILATERAL MAMMOGRAM WITH TOMOSYNTHESIS AND CAD
TECHNIQUE: Bilateral screening digital craniocaudal and mediolateral oblique
mammograms were obtained. Bilateral screening digital breast
tomosynthesis was performed. The images were evaluated with
computer-aided detection.

[R MLO synth-2D]
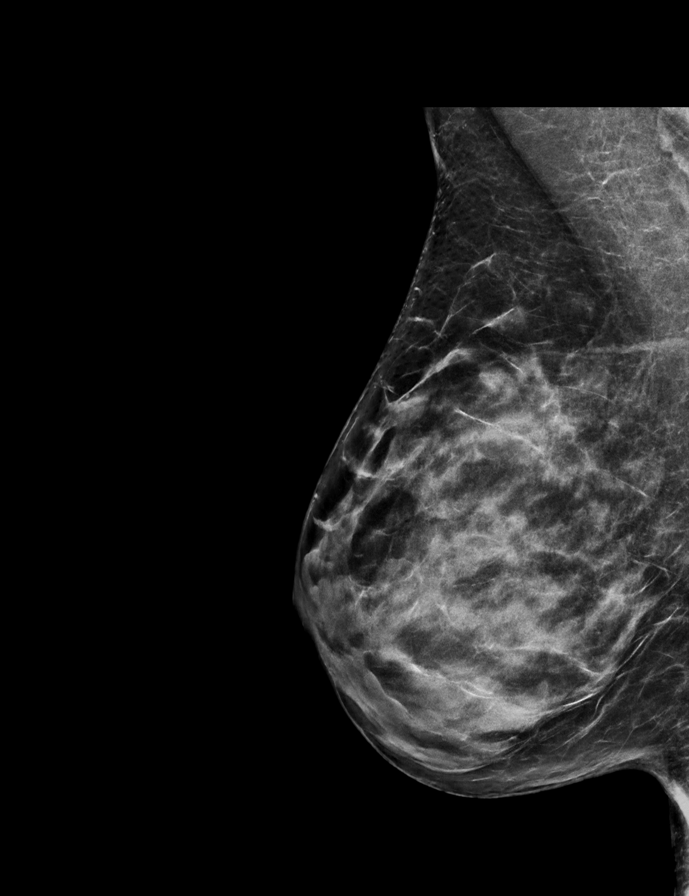

[L MLO synth-2D]
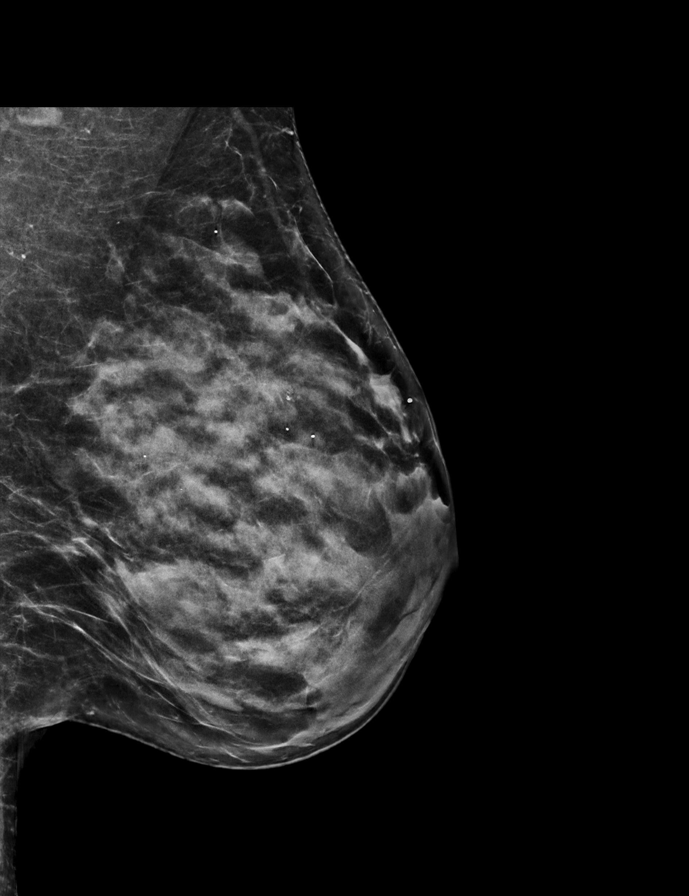

[L CC synth-2D]
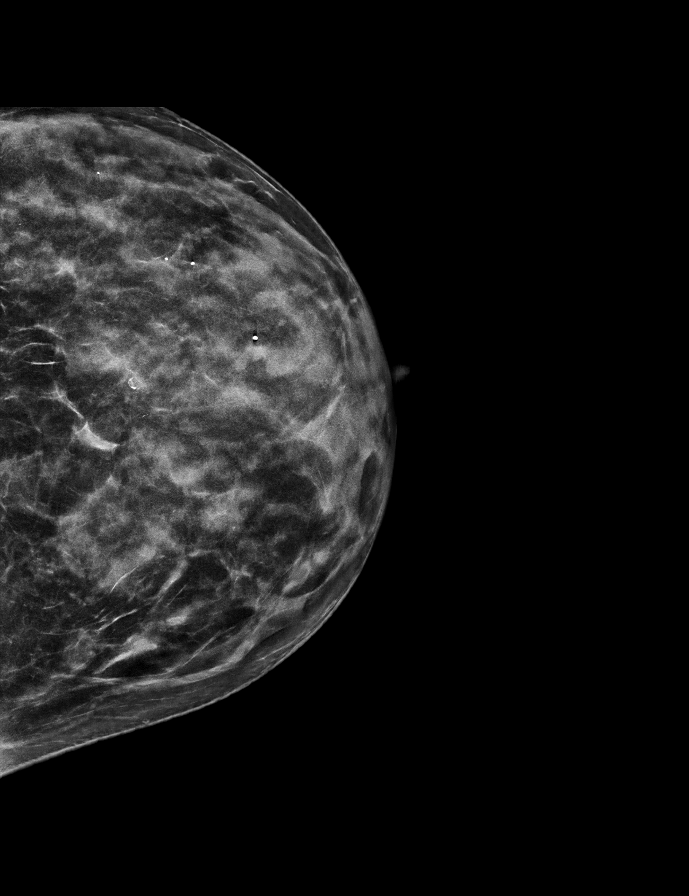

[R CC synth-2D]
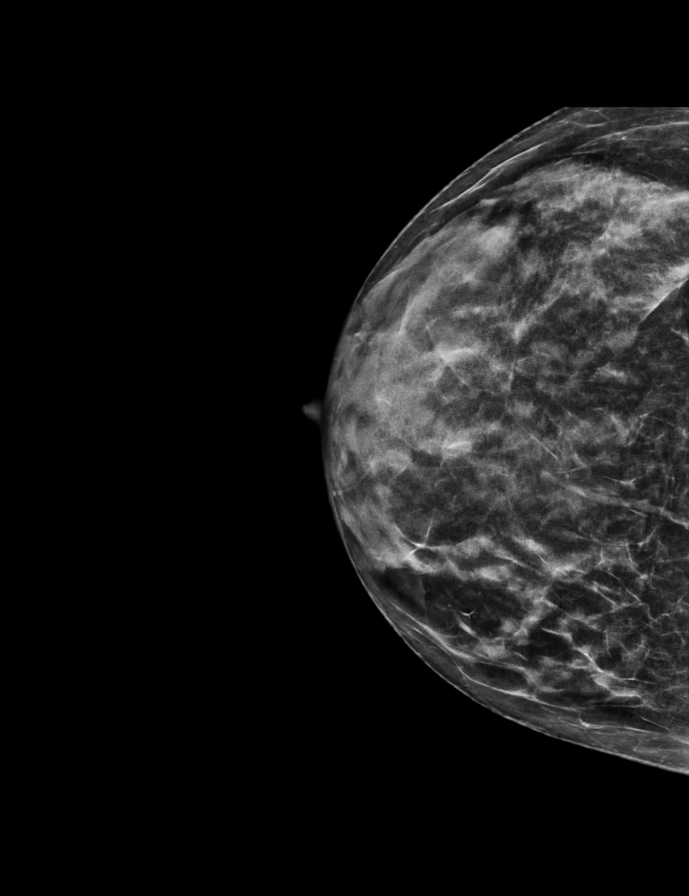

[R CC tomo · 2 of 54 frames shown]
[frame 18/54]
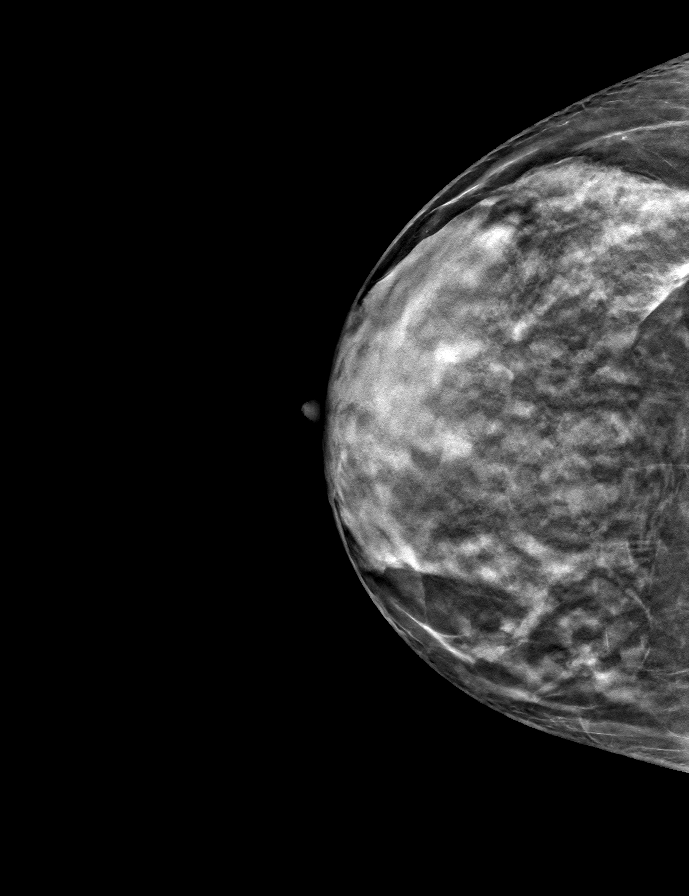
[frame 27/54]
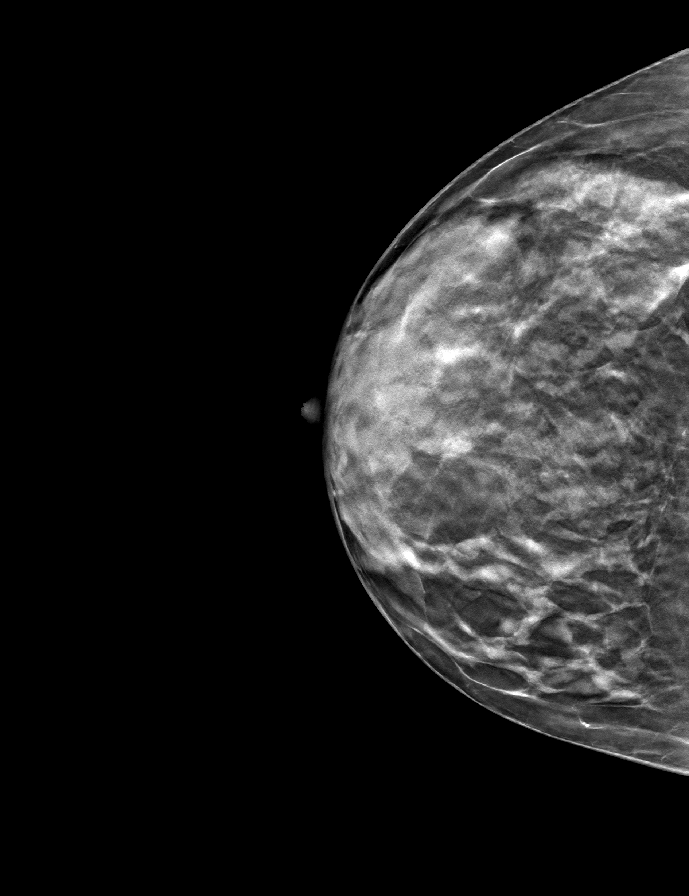

[L MLO tomo · tomo slice 33/66.0]
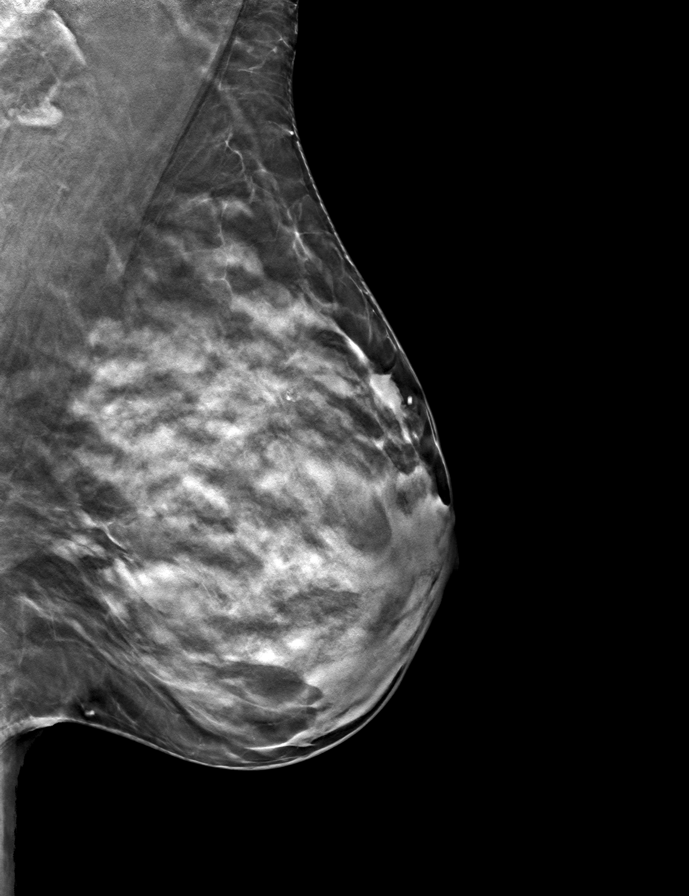

[L CC tomo · tomo slice 28/55.0]
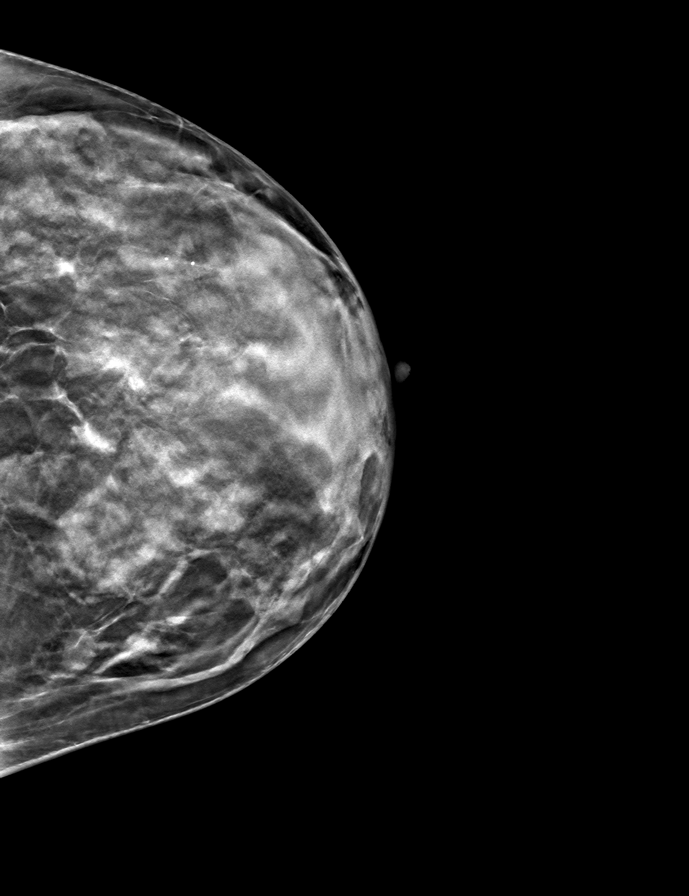

[R MLO tomo · tomo slice 33/66.0]
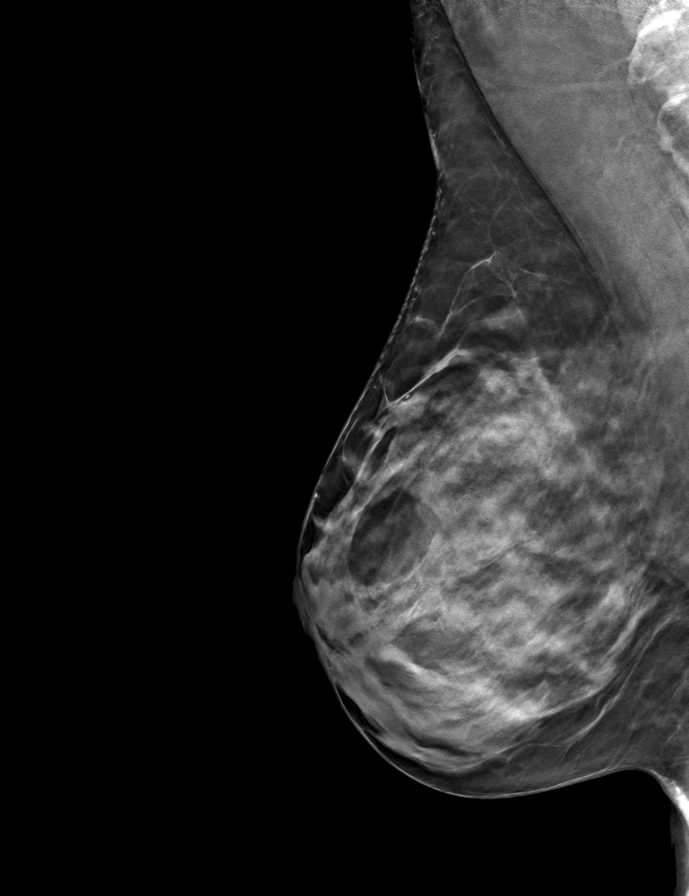

[9 of 24 positions shown; findings below may reference images not displayed]

ACR Breast Density Category d: The breast tissue is extremely dense,
which lowers the sensitivity of mammography
FINDINGS: There are no findings suspicious for malignancy. The images were
evaluated with computer-aided detection.
IMPRESSION: No mammographic evidence of malignancy. A result letter of this
screening mammogram will be mailed directly to the patient.

RECOMMENDATION:
Screening mammogram in one year. (Code:95-0-E9V)

BI-RADS CATEGORY  1: Negative.

## 2022-09-23 ENCOUNTER — Other Ambulatory Visit: Payer: Self-pay | Admitting: Obstetrics & Gynecology

## 2022-09-23 DIAGNOSIS — Z1231 Encounter for screening mammogram for malignant neoplasm of breast: Secondary | ICD-10-CM

## 2022-09-24 ENCOUNTER — Ambulatory Visit
Admission: RE | Admit: 2022-09-24 | Discharge: 2022-09-24 | Disposition: A | Discharge status: Home / Self Care | Payer: 59 | Source: Ambulatory Visit | Attending: Obstetrics & Gynecology | Admitting: Obstetrics & Gynecology

## 2022-09-24 DIAGNOSIS — Z1231 Encounter for screening mammogram for malignant neoplasm of breast: Secondary | ICD-10-CM

## 2023-06-24 ENCOUNTER — Ambulatory Visit: Payer: PRIVATE HEALTH INSURANCE

## 2023-07-29 DIAGNOSIS — M79642 Pain in left hand: Secondary | ICD-10-CM | POA: Insufficient documentation

## 2023-09-05 ENCOUNTER — Encounter: Payer: Self-pay | Admitting: Obstetrics & Gynecology

## 2023-09-24 ENCOUNTER — Ambulatory Visit: Admitting: Obstetrics and Gynecology

## 2023-10-03 ENCOUNTER — Encounter (HOSPITAL_BASED_OUTPATIENT_CLINIC_OR_DEPARTMENT_OTHER): Payer: Self-pay | Admitting: Family Medicine

## 2023-10-03 ENCOUNTER — Ambulatory Visit (HOSPITAL_BASED_OUTPATIENT_CLINIC_OR_DEPARTMENT_OTHER): Admitting: Family Medicine

## 2023-10-03 ENCOUNTER — Encounter (HOSPITAL_BASED_OUTPATIENT_CLINIC_OR_DEPARTMENT_OTHER): Payer: Self-pay

## 2023-10-03 VITALS — BP 120/79 | HR 58 | Ht 65.5 in | Wt 131.0 lb

## 2023-10-03 DIAGNOSIS — Z1231 Encounter for screening mammogram for malignant neoplasm of breast: Secondary | ICD-10-CM | POA: Diagnosis not present

## 2023-10-03 DIAGNOSIS — L989 Disorder of the skin and subcutaneous tissue, unspecified: Secondary | ICD-10-CM | POA: Diagnosis not present

## 2023-10-03 DIAGNOSIS — M1990 Unspecified osteoarthritis, unspecified site: Secondary | ICD-10-CM | POA: Insufficient documentation

## 2023-10-03 DIAGNOSIS — Z136 Encounter for screening for cardiovascular disorders: Secondary | ICD-10-CM | POA: Diagnosis not present

## 2023-10-03 DIAGNOSIS — M1991 Primary osteoarthritis, unspecified site: Secondary | ICD-10-CM | POA: Diagnosis not present

## 2023-10-03 NOTE — Assessment & Plan Note (Addendum)
 Cleared and low risk for upcoming facial plastic surgery.  Commended on healthy habits.  Last Colonoscopy circa 2016 with [facility].  Plans for a repeat next year.  Due for a Mammogram soon.  She isn't sure whether her GYN really checks her skin.  She prefers a female provider for this and I'll arrange.

## 2023-10-03 NOTE — Progress Notes (Signed)
 Established Patient Office Visit  Subjective   Patient ID: Sharon Lopez, female    DOB: 11-Apr-1960  Age: 63 y.o. MRN: [id number]  Chief Complaint  Patient presents with   New Patient (Initial Visit)    Patient is here today to get established with the practice. States she does have some problems with anxiety and said she might need to have an EKG and bloodwork done.    F/u as above.  Known to me from [facility].  Generally doing well with few concerns.  Does a fine job with her diet and exercise.  Upcoming plans for facial plastic surgery.  Extended discussion.    Past Medical History:  Diagnosis Date   DVT (deep venous thrombosis) (HCC) 04/09/2007   WAS ON BIRTH CONTROL PILLS   HSV-1 infection    oral   Osteoarthritis    Mild   Ovarian failure    f/by GYN    Outpatient Encounter Medications as of 10/03/2023  Medication Sig   ibuprofen (ADVIL,MOTRIN) 200 MG tablet Take 600 mg by mouth daily as needed for headache.   Ibuprofen-diphenhydrAMINE Cit (ADVIL PM PO) Take by mouth.   MELATONIN PO Take by mouth.   valACYclovir (VALTREX) 1000 MG tablet Take by mouth as needed.   No facility-administered encounter medications on file as of 10/03/2023.    Social History   Tobacco Use   Smoking status: Never   Smokeless tobacco: Never  Vaping Use   Vaping status: Never Used  Substance Use Topics   Alcohol use: Yes    Alcohol/week: 11.0 standard drinks of alcohol    Types: 11 Glasses of wine per week   Drug use: No      Review of Systems  Constitutional:  Negative for diaphoresis, fever, malaise/fatigue and weight loss.  Respiratory:  Negative for cough, shortness of breath and wheezing.   Cardiovascular:  Negative for chest pain, palpitations, orthopnea, claudication, leg swelling and PND.      Objective:     BP 120/79 (BP Location: Right Arm, Patient Position: Sitting, Cuff Size: Normal)   Pulse (!) 58   Ht 5' 5.5 (1.664 m)   Wt 131 lb (59.4 kg)   LMP  06/06/2013   SpO2 98%   BMI 21.47 kg/m    Physical Exam Constitutional:      General: She is not in acute distress.    Appearance: Normal appearance.     Comments: WDWN.  Athletic  HENT:     Head: Normocephalic.  Neck:     Vascular: No carotid bruit.   Cardiovascular:     Rate and Rhythm: Normal rate and regular rhythm.     Pulses: Normal pulses.     Heart sounds: Normal heart sounds.  Pulmonary:     Effort: Pulmonary effort is normal.     Breath sounds: Normal breath sounds.  Abdominal:     General: Bowel sounds are normal.     Palpations: Abdomen is soft.   Musculoskeletal:     Cervical back: Neck supple. No tenderness.     Right lower leg: No edema.     Left lower leg: No edema.   Neurological:     Mental Status: She is alert.      No results found for any visits on 10/03/23.    The 10-year ASCVD risk score (Arnett DK, et al., 2019) is: 2%    Assessment & Plan:  Primary osteoarthritis, unspecified site Assessment & Plan: Cleared and low risk for  upcoming facial plastic surgery.  Commended on healthy habits.  Last Colonoscopy circa 2016 with [facility].  Plans for a repeat next year.  Due for a Mammogram soon.  She isn't sure whether her GYN really checks her skin.  She prefers a female provider for this and I'll arrange.   Encounter for screening for cardiovascular disorders -     EKG 12-Lead -     Comprehensive metabolic panel with GFR -     CBC with Differential/Platelet -     Lipid panel  Breast cancer screening by mammogram -     3D Screening Mammogram, Left and Right; Future  Skin lesion -     Ambulatory referral to Dermatology  Other orders -     EKG 12-Lead    No follow-ups on file.    REDDING PONCE NORLEEN FALCON., MD

## 2023-10-04 LAB — CBC WITH DIFFERENTIAL/PLATELET
Basophils Absolute: 0.1 10*3/uL (ref 0.0–0.2)
Basos: 1 %
EOS (ABSOLUTE): 0.1 10*3/uL (ref 0.0–0.4)
Eos: 2 %
Hematocrit: 41.4 % (ref 34.0–46.6)
Hemoglobin: 13.6 g/dL (ref 11.1–15.9)
Immature Grans (Abs): 0 10*3/uL (ref 0.0–0.1)
Immature Granulocytes: 0 %
Lymphocytes Absolute: 1.3 10*3/uL (ref 0.7–3.1)
Lymphs: 22 %
MCH: 31.6 pg (ref 26.6–33.0)
MCHC: 32.9 g/dL (ref 31.5–35.7)
MCV: 96 fL (ref 79–97)
Monocytes Absolute: 0.3 10*3/uL (ref 0.1–0.9)
Monocytes: 6 %
Neutrophils Absolute: 3.9 10*3/uL (ref 1.4–7.0)
Neutrophils: 69 %
Platelets: 249 10*3/uL (ref 150–450)
RBC: 4.3 x10E6/uL (ref 3.77–5.28)
RDW: 11.8 % (ref 11.7–15.4)
WBC: 5.8 10*3/uL (ref 3.4–10.8)

## 2023-10-04 LAB — LIPID PANEL
Chol/HDL Ratio: 2.2 ratio (ref 0.0–4.4)
Cholesterol, Total: 222 mg/dL — ABNORMAL HIGH (ref 100–199)
HDL: 102 mg/dL (ref >39)
LDL Chol Calc (NIH): 110 mg/dL — ABNORMAL HIGH (ref 0–99)
Triglycerides: 58 mg/dL (ref 0–149)
VLDL Cholesterol Cal: 10 mg/dL (ref 5–40)

## 2023-10-04 LAB — COMPREHENSIVE METABOLIC PANEL WITH GFR
ALT: 12 IU/L (ref 0–32)
AST: 16 IU/L (ref 0–40)
Albumin: 4.9 g/dL (ref 3.8–4.9)
Alkaline Phosphatase: 25 IU/L — ABNORMAL LOW (ref 44–121)
BUN/Creatinine Ratio: 16 (ref 12–28)
BUN: 14 mg/dL (ref 8–27)
Bilirubin Total: 1.2 mg/dL (ref 0.0–1.2)
CO2: 21 mmol/L (ref 20–29)
Calcium: 9.8 mg/dL (ref 8.7–10.3)
Chloride: 103 mmol/L (ref 96–106)
Creatinine, Ser: 0.88 mg/dL (ref 0.57–1.00)
Globulin, Total: 1.8 g/dL (ref 1.5–4.5)
Glucose: 97 mg/dL (ref 70–99)
Potassium: 4.4 mmol/L (ref 3.5–5.2)
Sodium: 141 mmol/L (ref 134–144)
Total Protein: 6.7 g/dL (ref 6.0–8.5)
eGFR: 75 mL/min/{1.73_m2} (ref >59)

## 2023-10-06 ENCOUNTER — Ambulatory Visit (HOSPITAL_BASED_OUTPATIENT_CLINIC_OR_DEPARTMENT_OTHER): Payer: Self-pay | Admitting: Family Medicine

## 2023-10-22 ENCOUNTER — Ambulatory Visit
Admission: RE | Admit: 2023-10-22 | Discharge: 2023-10-22 | Disposition: A | Discharge status: Home / Self Care | Source: Ambulatory Visit | Attending: Family Medicine | Admitting: Family Medicine

## 2023-10-22 ENCOUNTER — Other Ambulatory Visit: Payer: Self-pay | Admitting: Family Medicine

## 2023-10-22 DIAGNOSIS — Z0000 Encounter for general adult medical examination without abnormal findings: Secondary | ICD-10-CM

## 2023-10-22 NOTE — Progress Notes (Incomplete)
 63 y.o. G90P3003 female here for annual exam. Married.  Patient's last menstrual period was 06/06/2013.    She reports ***.  Urine sample provided: ***  Abnormal bleeding: *** Pelvic discharge or pain: *** Breast mass, nipple discharge or skin changes : ***  Sexually active: *** Birth control: *** Last PAP:     Component Value Date/Time   DIAGPAP  10/04/2020 1133    - Negative for intraepithelial lesion or malignancy (NILM)   ADEQPAP  10/04/2020 1133    Satisfactory for evaluation; transformation zone component PRESENT.   Last mammogram: 09/24/22 Bi-Rads 1 Last colonoscopy: 07/07/14 10 year recall  Exercising: *** Smoker: ***  Flowsheet Row Office Visit from 10/03/2023 in [facility]  PHQ-2 Total Score 4    Flowsheet Row Office Visit from 10/03/2023 in [facility]  PHQ-9 Total Score 22     GYN HISTORY: ***  OB History  Gravida Para Term Preterm AB Living  3 3 3   3   SAB IAB Ectopic Multiple Live Births          # Outcome Date GA Lbr Len/2nd Weight Sex Type Anes PTL Lv  3 Term           2 Term           1 Term            Past Medical History:  Diagnosis Date   DVT (deep venous thrombosis) (HCC) 04/09/2007   WAS ON BIRTH CONTROL PILLS   HSV-1 infection    oral   Osteoarthritis    Mild   Ovarian failure    f/by GYN   Past Surgical History:  Procedure Laterality Date   DILATATION & CURETTAGE/HYSTEROSCOPY WITH TRUECLEAR N/A 11/26/2012   Procedure: DILATATION & CURETTAGE/HYSTEROSCOPY WITH TRUECLEAR;  Surgeon: Evalene SHAUNNA Organ, MD;  Location: [facility];  Service: Gynecology;  Laterality: N/A;  [facility] INSERVICE NURSES   ROOT CANAL     Current Outpatient Medications on File Prior to Visit  Medication Sig Dispense Refill   ibuprofen (ADVIL,MOTRIN) 200 MG tablet Take 600 mg by mouth daily as needed for headache.     Ibuprofen-diphenhydrAMINE Cit (ADVIL PM PO) Take by mouth.     MELATONIN  PO Take by mouth.     valACYclovir (VALTREX) 1000 MG tablet Take by mouth as needed.     No current facility-administered medications on file prior to visit.   Social History   Socioeconomic History   Marital status: Married    Spouse name: Not on file   Number of children: Not on file   Years of education: Not on file   Highest education level: Not on file  Occupational History   Not on file  Tobacco Use   Smoking status: Never   Smokeless tobacco: Never  Vaping Use   Vaping status: Never Used  Substance and Sexual Activity   Alcohol use: Yes    Alcohol/week: 11.0 standard drinks of alcohol    Types: 11 Glasses of wine per week   Drug use: No   Sexual activity: Yes    Partners: Male    Birth control/protection: Post-menopausal  Other Topics Concern   Not on file  Social History Narrative   Not on file   Social Drivers of Health   Financial Resource Strain: Not on file  Food Insecurity: Not on file  Transportation Needs: Not on file  Physical Activity: Not on file  Stress: Not on file  Social Connections: Not on file  Intimate Partner Violence: Not on file   Family History  Problem Relation Age of Onset   Heart failure Mother    Cancer Mother        Lung   Other Mother        per patient, precancerous uterus cell?   Prostate cancer Father    Heart disease Maternal Grandmother    Breast cancer Maternal Grandmother 80   Stroke Paternal Grandfather    Allergies  Allergen Reactions   Amoxicillin     Upset stomach     PE There were no vitals filed for this visit. There is no height or weight on file to calculate BMI.  Physical Exam    Assessment and Plan:        There are no diagnoses linked to this encounter. Clotilda FORBES Pa, CMA

## 2023-10-23 ENCOUNTER — Ambulatory Visit: Admitting: Obstetrics and Gynecology

## 2023-11-05 ENCOUNTER — Ambulatory Visit: Admitting: Obstetrics and Gynecology

## 2023-11-06 ENCOUNTER — Encounter (HOSPITAL_BASED_OUTPATIENT_CLINIC_OR_DEPARTMENT_OTHER): Payer: Self-pay | Admitting: Family Medicine

## 2023-12-09 ENCOUNTER — Ambulatory Visit (INDEPENDENT_AMBULATORY_CARE_PROVIDER_SITE_OTHER): Admitting: Family Medicine

## 2023-12-09 ENCOUNTER — Ambulatory Visit: Payer: Self-pay

## 2023-12-09 ENCOUNTER — Telehealth (HOSPITAL_BASED_OUTPATIENT_CLINIC_OR_DEPARTMENT_OTHER): Payer: Self-pay | Admitting: *Deleted

## 2023-12-09 ENCOUNTER — Encounter (HOSPITAL_BASED_OUTPATIENT_CLINIC_OR_DEPARTMENT_OTHER): Payer: Self-pay | Admitting: Family Medicine

## 2023-12-09 VITALS — BP 124/80 | HR 73 | Resp 16 | Ht 64.96 in | Wt 128.5 lb

## 2023-12-09 DIAGNOSIS — Z8619 Personal history of other infectious and parasitic diseases: Secondary | ICD-10-CM

## 2023-12-09 DIAGNOSIS — J029 Acute pharyngitis, unspecified: Secondary | ICD-10-CM | POA: Insufficient documentation

## 2023-12-09 DIAGNOSIS — J040 Acute laryngitis: Secondary | ICD-10-CM | POA: Insufficient documentation

## 2023-12-09 MED ORDER — VALACYCLOVIR HCL 1 G PO TABS
1000.0000 mg | ORAL_TABLET | Freq: Every day | ORAL | 5 refills | Status: AC
Start: 2023-12-09 — End: ?

## 2023-12-09 MED ORDER — AZITHROMYCIN 250 MG PO TABS: ORAL_TABLET | ORAL | 0 refills | Status: AC

## 2023-12-09 NOTE — Telephone Encounter (Signed)
 FYI Only or Action Required?: FYI only for provider.  Patient was last seen in primary care on 10/03/2023 by Dottie Norleen PHEBE PONCE, MD.  Called Nurse Triage reporting Sore Throat.  Symptoms began several days ago.  Interventions attempted: OTC medications: Tylenol .  Symptoms are: unchanged.  Triage Disposition: See PCP When Office is Open (Within 3 Days)  Patient/caregiver understands and will follow disposition?: Yes     Copied from CRM #[id number]. Topic: Clinical - Red Word Triage >> Dec 09, 2023 12:52 PM Charlet HERO wrote: Red Word that prompted transfer to Nurse Triage: Patient is calling about scratchy throat and it felt like on fire last night and its sore, she also feels like it is tightening up. Reason for Disposition  [1] Sore throat is the only symptom AND [2] present > 48 hours  Answer Assessment - Initial Assessment Questions Procedure for botox in neck last week, experiencing throat scratchiness. Grandson has Hand, foot, mouth, was with him yesterday. Throat completely on fire last night. Took 2 tylenol  this morning, feeling hesitation on swallowing, felt tight.    1. ONSET: When did the throat start hurting? (Hours or days ago)      Sore after injection last week, raspy voice 3 days later, gradually gotten worse everyday.   2. SEVERITY: How bad is the sore throat? (Scale 1-10; mild, moderate or severe)     10/10 last night   3. FEVER: Do you have a fever? If Yes, ask: What is your temperature, how was it measured, and when did it start?      98.2 taken orally while on phone  Protocols used: Sore Throat-A-AH

## 2023-12-09 NOTE — Progress Notes (Signed)
 Established Patient Office Visit  Subjective   Patient ID: Sharon Lopez, female    DOB: 08-01-1960  Age: 63 y.o. MRN: [id number]  Chief Complaint  Patient presents with   Throat soreness     Throat soreness     F/u as above.  Recent hoarseness for the last week.  Developed a ST some days ago as well.  No recent yelling/excessive talking.  Recent Botox injection to her anterior neck also noted, though not likely related.  Sinuses have been pretty clear.  Minimal allergies.  No fever or coughing.  No known GERD symptoms.    Past Medical History:  Diagnosis Date   DVT (deep venous thrombosis) (HCC) 04/09/2007   WAS ON BIRTH CONTROL PILLS   HSV-1 infection    oral   Osteoarthritis    Mild   Ovarian failure    f/by GYN    Outpatient Encounter Medications as of 12/09/2023  Medication Sig   azithromycin  (ZITHROMAX ) 250 MG tablet Take 2 tablets on day 1, then 1 tablet daily on days 2 through 5   ibuprofen (ADVIL,MOTRIN) 200 MG tablet Take 600 mg by mouth daily as needed for headache.   Ibuprofen-diphenhydrAMINE Cit (ADVIL PM PO) Take by mouth.   [DISCONTINUED] valACYclovir  (VALTREX ) 1000 MG tablet Take by mouth as needed.   MELATONIN PO Take by mouth. (Patient not taking: Reported on 12/09/2023)   valACYclovir  (VALTREX ) 1000 MG tablet Take 1 tablet (1,000 mg total) by mouth daily.   No facility-administered encounter medications on file as of 12/09/2023.    Social History   Tobacco Use   Smoking status: Never   Smokeless tobacco: Never  Vaping Use   Vaping status: Never Used  Substance Use Topics   Alcohol use: Yes    Alcohol/week: 11.0 standard drinks of alcohol    Types: 11 Glasses of wine per week   Drug use: No      ROS    Objective:     BP 124/80 (BP Location: Right Arm, Patient Position: Sitting, Cuff Size: Normal)   Pulse 73   Resp 16   Ht 5' 4.96 (1.65 m)   Wt 128 lb 8 oz (58.3 kg)   LMP 06/06/2013   SpO2 96%   BMI 21.41 kg/m    Physical  Exam Constitutional:      General: She is not in acute distress.    Appearance: She is not ill-appearing, toxic-appearing or diaphoretic.  HENT:     Right Ear: Tympanic membrane normal.     Left Ear: Tympanic membrane normal.     Nose: Congestion present. No rhinorrhea.     Right Sinus: No maxillary sinus tenderness or frontal sinus tenderness.     Left Sinus: No maxillary sinus tenderness or frontal sinus tenderness.     Mouth/Throat:     Pharynx: Posterior oropharyngeal erythema present. No oropharyngeal exudate.  Eyes:     Conjunctiva/sclera: Conjunctivae normal.  Neck:     Comments: Thyroid  normal to palpation.  Minor anterior neck tenderness at Botox injection site. Cardiovascular:     Rate and Rhythm: Normal rate and regular rhythm.     Heart sounds: Normal heart sounds.  Pulmonary:     Breath sounds: Normal breath sounds.  Abdominal:     Palpations: Abdomen is soft.     Tenderness: There is no abdominal tenderness.  Musculoskeletal:     Cervical back: Normal range of motion and neck supple. No tenderness.  Lymphadenopathy:     Cervical:  No cervical adenopathy.  Skin:    General: Skin is warm and dry.     Findings: No rash.  Neurological:     Mental Status: She is alert.      No results found for any visits on 12/09/23.    The ASCVD Risk score (Arnett DK, et al., 2019) failed to calculate for the following reasons:   The valid HDL cholesterol range is 20 to 100 mg/dL    Assessment & Plan:  Pharyngitis, unspecified etiology Assessment & Plan: Possibly bacterial at this point and will cover.  Allegra prn.  Discussed possible occult GERD in detailed, lay terms.  Lifestyle and dietary precautions.  Liquid antacid prn.  Pepcid at hs prn.  Alert me if not clearly better in 3-4 days.  Alert me if not completely better in one week.  Orders: -     Azithromycin ; Take 2 tablets on day 1, then 1 tablet daily on days 2 through 5  Dispense: 6 tablet; Refill: 0  H/O cold  sores -     valACYclovir  HCl; Take 1 tablet (1,000 mg total) by mouth daily.  Dispense: 5 tablet; Refill: 5  Laryngitis  I personally spent a total of 30 minutes in the care of the patient today including performing a medically appropriate exam/evaluation, counseling and educating, placing orders, documenting clinical information in the EHR, and communicating results.   Return if symptoms worsen or fail to improve.    Sharon Lopez., MD

## 2023-12-09 NOTE — Assessment & Plan Note (Signed)
 Possibly bacterial at this point and will cover.  Allegra prn.  Discussed possible occult GERD in detailed, lay terms.  Lifestyle and dietary precautions.  Liquid antacid prn.  Pepcid at hs prn.  Alert me if not clearly better in 3-4 days.  Alert me if not completely better in one week.

## 2023-12-12 ENCOUNTER — Ambulatory Visit (HOSPITAL_BASED_OUTPATIENT_CLINIC_OR_DEPARTMENT_OTHER): Admitting: Family Medicine

## 2023-12-24 NOTE — Telephone Encounter (Signed)
 Pt. Made aware.

## 2024-03-23 ENCOUNTER — Other Ambulatory Visit (HOSPITAL_BASED_OUTPATIENT_CLINIC_OR_DEPARTMENT_OTHER): Payer: Self-pay | Admitting: Family Medicine

## 2024-03-23 DIAGNOSIS — F419 Anxiety disorder, unspecified: Secondary | ICD-10-CM

## 2024-03-23 MED ORDER — CLONAZEPAM 0.5 MG PO TABS: 0.5000 mg | ORAL_TABLET | Freq: Two times a day (BID) | ORAL | 0 refills | Status: AC | PRN
